# Patient Record
Sex: Male | Born: 1940 | Race: White | Hispanic: No | Marital: Married | Smoking: Current every day smoker
Health system: Southern US, Community
[De-identification: ages and names within clinical notes are randomized; demographics above are authoritative.]

## PROBLEM LIST (undated history)

## (undated) DIAGNOSIS — K52839 Microscopic colitis, unspecified: Secondary | ICD-10-CM

## (undated) DIAGNOSIS — M1990 Unspecified osteoarthritis, unspecified site: Secondary | ICD-10-CM

## (undated) DIAGNOSIS — K8020 Calculus of gallbladder without cholecystitis without obstruction: Secondary | ICD-10-CM

## (undated) DIAGNOSIS — K7460 Unspecified cirrhosis of liver: Secondary | ICD-10-CM

## (undated) DIAGNOSIS — K7581 Nonalcoholic steatohepatitis (NASH): Secondary | ICD-10-CM

## (undated) HISTORY — PX: OTHER SURGICAL HISTORY: SHX169

## (undated) HISTORY — DX: Nonalcoholic steatohepatitis (NASH): K75.81

## (undated) HISTORY — DX: Calculus of gallbladder without cholecystitis without obstruction: K80.20

## (undated) HISTORY — DX: Unspecified cirrhosis of liver: K74.60

## (undated) HISTORY — DX: Unspecified osteoarthritis, unspecified site: M19.90

## (undated) HISTORY — DX: Microscopic colitis, unspecified: K52.839

## (undated) HISTORY — PX: UPPER GI ENDOSCOPY: SHX6162

## (undated) HISTORY — PX: ANKLE SURGERY: SHX546

## (undated) HISTORY — PX: MULTIPLE TOOTH EXTRACTIONS: SHX2053

---

## 2015-11-25 ENCOUNTER — Encounter: Payer: Self-pay | Admitting: Family Medicine

## 2015-11-25 ENCOUNTER — Ambulatory Visit (INDEPENDENT_AMBULATORY_CARE_PROVIDER_SITE_OTHER): Payer: Medicare Other | Admitting: Family Medicine

## 2015-11-25 VITALS — BP 117/65 | HR 89 | Temp 98.1°F | Ht 68.0 in | Wt 228.4 lb

## 2015-11-25 DIAGNOSIS — K7581 Nonalcoholic steatohepatitis (NASH): Secondary | ICD-10-CM

## 2015-11-25 DIAGNOSIS — F17200 Nicotine dependence, unspecified, uncomplicated: Secondary | ICD-10-CM | POA: Insufficient documentation

## 2015-11-25 DIAGNOSIS — M170 Bilateral primary osteoarthritis of knee: Secondary | ICD-10-CM | POA: Insufficient documentation

## 2015-11-25 DIAGNOSIS — Z7689 Persons encountering health services in other specified circumstances: Secondary | ICD-10-CM | POA: Insufficient documentation

## 2015-11-25 DIAGNOSIS — I10 Essential (primary) hypertension: Secondary | ICD-10-CM | POA: Insufficient documentation

## 2015-11-25 DIAGNOSIS — K7460 Unspecified cirrhosis of liver: Secondary | ICD-10-CM | POA: Insufficient documentation

## 2015-11-25 DIAGNOSIS — Z131 Encounter for screening for diabetes mellitus: Secondary | ICD-10-CM

## 2015-11-25 DIAGNOSIS — Z23 Encounter for immunization: Secondary | ICD-10-CM | POA: Diagnosis not present

## 2015-11-25 DIAGNOSIS — Z9109 Other allergy status, other than to drugs and biological substances: Secondary | ICD-10-CM | POA: Diagnosis not present

## 2015-11-25 LAB — CBC
HEMATOCRIT: 40.5 % (ref 38.5–50.0)
Hemoglobin: 14 g/dL (ref 13.2–17.1)
MCH: 32.9 pg (ref 27.0–33.0)
MCHC: 34.6 g/dL (ref 32.0–36.0)
MCV: 95.1 fL (ref 80.0–100.0)
MPV: 9.9 fL (ref 7.5–12.5)
PLATELETS: 69 10*3/uL — AB (ref 140–400)
RBC: 4.26 MIL/uL (ref 4.20–5.80)
RDW: 14.3 % (ref 11.0–15.0)
WBC: 4.7 10*3/uL (ref 3.8–10.8)

## 2015-11-25 LAB — COMPLETE METABOLIC PANEL WITHOUT GFR
ALT: 28 U/L (ref 9–46)
AST: 41 U/L — ABNORMAL HIGH (ref 10–35)
Albumin: 3.3 g/dL — ABNORMAL LOW (ref 3.6–5.1)
Alkaline Phosphatase: 76 U/L (ref 40–115)
BUN: 12 mg/dL (ref 7–25)
CO2: 24 mmol/L (ref 20–31)
Calcium: 8.3 mg/dL — ABNORMAL LOW (ref 8.6–10.3)
Chloride: 108 mmol/L (ref 98–110)
Creat: 0.77 mg/dL (ref 0.70–1.18)
GFR, Est African American: >89 mL/min
GFR, Est Non African American: 88 mL/min
Glucose, Bld: 92 mg/dL (ref 65–99)
Potassium: 3.9 mmol/L (ref 3.5–5.3)
Sodium: 140 mmol/L (ref 135–146)
Total Bilirubin: 1.8 mg/dL — ABNORMAL HIGH (ref 0.2–1.2)
Total Protein: 6.6 g/dL (ref 6.1–8.1)

## 2015-11-25 LAB — LIPID PANEL
CHOL/HDL RATIO: 3.3 ratio (ref <5.0)
Cholesterol: 100 mg/dL (ref <200)
HDL: 30 mg/dL — AB (ref >40)
LDL CALC: 55 mg/dL (ref <100)
Triglycerides: 76 mg/dL (ref <150)
VLDL: 15 mg/dL (ref <30)

## 2015-11-25 LAB — POCT GLYCOSYLATED HEMOGLOBIN (HGB A1C): HEMOGLOBIN A1C: 5.2

## 2015-11-25 MED ORDER — FUROSEMIDE 40 MG PO TABS: 40.0000 mg | ORAL_TABLET | Freq: Every day | ORAL | 3 refills | Status: DC

## 2015-11-25 NOTE — Patient Instructions (Signed)
You were seen today in clinic to establish care.  You look well today.  I have checked some labs for you and I will contact you with these results.  I have also refilled your lasix 9m.    I would like to see you back in 3 months to discuss smoking cessation.   Very nice meeting you, Daniel Lambert. WRosalyn Gess [place] Resident PGY-1 11/25/2015 2:59 PM

## 2015-11-25 NOTE — Assessment & Plan Note (Signed)
Encounter to establish care New patient to the practice, who recently moved from [facility] with his wife.  Apparently up-to-date with her immunizations, health screenings. I will confirm this when I receive his medical record.  For now I will obtain some baseline labs, including CMET CBC, A1c and lipid panel.  - Follow-up CMET, CBC, A1c, lipid panel - f/u medical records - f/u 6 months for general follow up

## 2015-11-25 NOTE — Progress Notes (Signed)
    Subjective:  Daniel Lambert is a 75 y.o. male who presents to the [facility] today with a chief complaint of establish medical care  HPI:  Establish care: No complaints today, patient is here to establish care after recently moving from [facility].  Is reportedly up to date with all of his immunizations, and health screening.  Patient signed medical release forms.  I do not have any labs available to me.  NASH: Patient was diagnosed with NASH a "bunch of years ago".  Patient reports that he received abdominal ultrasounds twice yearly to "screen for cancer".  He was unclear of why.  Has never had a paracentesis.  Denies any fatigue, alcohol use or former IV drug use. Takes 60m lasix daily.   Tobacco use: Smokes half pack per day has been smoking this 60 years. Wife also smokes, and both of them are not ready to quit currently but are considering.  Patient denies any cough, chest pain, shortness of breath, LE swelling or history of VTE.    PMH: NASH, tobacco use,  Tobacco use: 1/2 PPD, 30 pack year history Medication: reviewed and updated ROS: see HPI   Objective:  Physical Exam: BP 117/65   Pulse 89   Temp 98.1 F (36.7 C) (Oral)   Ht 5' 8"  (1.727 m)   Wt 228 lb 6.4 oz (103.6 kg)   SpO2 97%   BMI 34.73 kg/m   Gen: 75yo M in NAD, resting comfortably CV: RRR with no murmurs appreciated Pulm: NWOB, CTAB with no crackles, wheezes, or rhonchi GI: Normal bowel sounds present. Soft, Nontender, Nondistended. MSK: no edema, cyanosis, or clubbing noted Skin: warm, dry Neuro: grossly normal, moves all extremities Psych: Normal affect and thought content  Results for orders placed or performed in visit on 11/25/15 (from the past 72 hour(s))  POCT glycosylated hemoglobin (Hb A1C)     Status: None   Collection Time: 11/25/15  3:06 PM  Result Value Ref Range   Hemoglobin A1C 5.2      Assessment/Plan:  Encounter to establish care Encounter to establish care New patient to the  practice, who recently moved from [facility].  Apparently up-to-date with her immunizations, health screenings. I will confirm this when I receive his medical record.  For now I will obtain some baseline labs, including CMET CBC, A1c and lipid panel.  - Follow-up CMET, CBC, A1c, lipid panel - f/u medical records - f/u 6 months for general follow up  Tobacco use disorder Tobacco use disorder Patient is a smoker for 60 years, currently smokes one half pack per day. Denies any cough, chest pain, shortness of breath, but does have some LLL crackles.  Spoke with patient and his wife about smoking cessation.  They are open to considering it in 6 months.  - Follow-up in 6 months, and consider referring patient to Dr. KGraylin Shiversmoking cessation clinic

## 2015-11-25 NOTE — Assessment & Plan Note (Signed)
Tobacco use disorder Patient is a smoker for 60 years, currently smokes one half pack per day. Denies any cough, chest pain, shortness of breath, but does have some LLL crackles.  Spoke with patient and his wife about smoking cessation.  They are open to considering it in 6 months.  - Follow-up in 6 months, and consider referring patient to Dr. Graylin Shiver smoking cessation clinic

## 2016-02-09 ENCOUNTER — Other Ambulatory Visit: Payer: Self-pay | Admitting: Family Medicine

## 2016-02-09 MED ORDER — FUROSEMIDE 40 MG PO TABS: 40.0000 mg | ORAL_TABLET | Freq: Every day | ORAL | 3 refills | Status: DC

## 2016-02-09 NOTE — Telephone Encounter (Signed)
Pt needs refills on Lasix and quaidine (don't know the correct spelling, it is not in pt's chart). Pt would like 3 refills and send Rx's to [facility]. ep

## 2016-03-07 ENCOUNTER — Other Ambulatory Visit: Payer: Self-pay | Admitting: Nurse Practitioner

## 2016-03-07 DIAGNOSIS — K7469 Other cirrhosis of liver: Secondary | ICD-10-CM

## 2016-03-07 DIAGNOSIS — K7581 Nonalcoholic steatohepatitis (NASH): Secondary | ICD-10-CM

## 2016-03-16 ENCOUNTER — Ambulatory Visit
Admission: RE | Admit: 2016-03-16 | Discharge: 2016-03-16 | Disposition: A | Discharge status: Home / Self Care | Payer: Medicare Other | Source: Ambulatory Visit | Attending: Nurse Practitioner | Admitting: Nurse Practitioner

## 2016-03-16 DIAGNOSIS — K7469 Other cirrhosis of liver: Secondary | ICD-10-CM

## 2016-03-16 DIAGNOSIS — K7581 Nonalcoholic steatohepatitis (NASH): Secondary | ICD-10-CM

## 2016-04-18 ENCOUNTER — Other Ambulatory Visit: Payer: Self-pay | Admitting: Family Medicine

## 2016-05-18 ENCOUNTER — Other Ambulatory Visit: Payer: Self-pay | Admitting: Family Medicine

## 2016-07-03 ENCOUNTER — Encounter: Payer: Self-pay | Admitting: Gastroenterology

## 2016-07-23 ENCOUNTER — Ambulatory Visit (INDEPENDENT_AMBULATORY_CARE_PROVIDER_SITE_OTHER): Payer: Medicare Other | Admitting: Family

## 2016-07-23 ENCOUNTER — Encounter: Payer: Self-pay | Admitting: Family

## 2016-07-23 VITALS — BP 130/70 | HR 62 | Temp 97.7°F | Resp 16 | Ht 68.0 in | Wt 224.0 lb

## 2016-07-23 DIAGNOSIS — K529 Noninfective gastroenteritis and colitis, unspecified: Secondary | ICD-10-CM | POA: Insufficient documentation

## 2016-07-23 DIAGNOSIS — Z7689 Persons encountering health services in other specified circumstances: Secondary | ICD-10-CM | POA: Diagnosis not present

## 2016-07-23 DIAGNOSIS — F17200 Nicotine dependence, unspecified, uncomplicated: Secondary | ICD-10-CM | POA: Diagnosis not present

## 2016-07-23 DIAGNOSIS — K7581 Nonalcoholic steatohepatitis (NASH): Secondary | ICD-10-CM

## 2016-07-23 DIAGNOSIS — K7460 Unspecified cirrhosis of liver: Secondary | ICD-10-CM | POA: Diagnosis not present

## 2016-07-23 MED ORDER — FUROSEMIDE 40 MG PO TABS: 40.0000 mg | ORAL_TABLET | Freq: Every day | ORAL | 0 refills | Status: DC

## 2016-07-23 MED ORDER — DIPHENOXYLATE-ATROPINE 2.5-0.025 MG PO TABS: 1.0000 | ORAL_TABLET | Freq: Two times a day (BID) | ORAL | 0 refills | Status: DC | PRN

## 2016-07-23 NOTE — Assessment & Plan Note (Signed)
Diagnosed with colitis and diarrhea symptoms controlled with Lomitl. Abdominal exam is benign with no symptoms of exacerbation. Has scheduled follow up with [facility]. Continue current dosage of Lotmil pending GI appointment.

## 2016-07-23 NOTE — Assessment & Plan Note (Signed)
Continues to smoke about 0.5 packs per day for about 60 years with current 30 pack year history. In the pre-contemplation stage of change with no intentions of quitting currently. Recommend tobacco cessation to reduce risk for cardiovascular, respiratory and malignant diseases in the future. Continue to monitor.

## 2016-07-23 NOTE — Patient Instructions (Signed)
Thank you for choosing [facility].  SUMMARY AND INSTRUCTIONS:  Continue to take your medications as prescribed.   Follow up with Dr. Ardis Hughs as scheduled.  Schedule for your physical/wellness at you convenience.   Medication:  Your prescription(s) have been submitted to your pharmacy or been printed and provided for you. Please take as directed and contact our office if you believe you are having problem(s) with the medication(s) or have any questions.  Labs:  Please stop by the lab on the lower level of the building for your blood work. Your results will be released to [place] (or called to you) after review, usually within 72 hours after test completion. If any changes need to be made, you will be notified at that same time.  1.) The lab is open from 7:30am to 5:30 pm Monday-Friday 2.) No appointment is necessary 3.) Fasting (if needed) is 6-8 hours after food and drink; black coffee and water are okay   Follow up:  If your symptoms worsen or fail to improve, please contact our office for further instruction, or in case of emergency go directly to the emergency room at the closest medical facility.

## 2016-07-23 NOTE — Progress Notes (Signed)
Subjective:    Patient ID: Daniel Lambert, male    DOB: [birth year], 76 y.o.   MRN: [id number]  Chief Complaint  Patient presents with  . Establish Care    colitis issues that he has an appointment on August 1st with GI    HPI:  Daniel Lambert is a 76 y.o. male who  has a past medical history of Arthritis; Chicken pox; Cirrhosis ([place]); Microscopic colitis; and Microscopic colitis. and presents today for an office visit to establish care.  1.) NASH - Currently maintained on furosemide for edema control. Reports taking the medication as prescribed and denies adverse side effects. No current right upper quadrant pain. Does have some mild/moderate lower extremity edema. Does not currently drink alcohol. No bleeding. Most recent imaging performed by   2.) Colitis - Previously diagnosed with colotis and maintained on Lomtil which helps control his symptoms. Reports taking the medicaiton as prescribed and denies adverse side effects. Endorses lower abdominal pain with no nausea, vomiting, constipation or blood in his stool. Eating and drinking okay. Frequency of bowel movements is 3-4 times per day.   Allergies  Allergen Reactions  . Penicillins Hives    itching      Outpatient Medications Prior to Visit  Medication Sig Dispense Refill  . furosemide (LASIX) 40 MG tablet TAKE 1 TABLET DAILY 30 tablet 3   No facility-administered medications prior to visit.      Past Medical History:  Diagnosis Date  . Arthritis   . Chicken pox   . Cirrhosis ([place])   . Microscopic colitis   . Microscopic colitis       Past Surgical History:  Procedure Laterality Date  . ANKLE SURGERY Right       Family History  Problem Relation Age of Onset  . Alzheimer's disease Mother   . Heart disease Father       Social History   Social History  . Marital status: Married    Spouse name: N/A  . Number of children: 3  . Years of education: 14   Occupational History  . Retired      Social History Main Topics  . Smoking status: Current Every Day Smoker    Packs/day: 0.50    Years: 60.00    Types: Cigarettes  . Smokeless tobacco: Never Used  . Alcohol use No  . Drug use: No  . Sexual activity: Not on file   Other Topics Concern  . Not on file   Social History Narrative   Fun/Hobby: Fishing         Review of Systems  Constitutional: Negative for chills and fever.  Respiratory: Negative for chest tightness and shortness of breath.   Cardiovascular: Negative for chest pain, palpitations and leg swelling.  Gastrointestinal: Positive for abdominal pain and diarrhea. Negative for blood in stool, constipation, rectal pain and vomiting.       Objective:    BP 130/70 (BP Location: Left Arm, Patient Position: Sitting, Cuff Size: Large)   Pulse 62   Temp 97.7 F (36.5 C) (Oral)   Resp 16   Ht 5' 8"  (1.727 m)   Wt 224 lb (101.6 kg)   SpO2 96%   BMI 34.06 kg/m  Nursing note and vital signs reviewed.  Physical Exam  Constitutional: He is oriented to person, place, and time. He appears well-developed and well-nourished. No distress.  Cardiovascular: Normal rate, regular rhythm, normal heart sounds and intact distal pulses.   Pulmonary/Chest: Effort normal and  breath sounds normal.  Abdominal: Normal appearance and bowel sounds are normal. He exhibits no mass. There is no hepatosplenomegaly. There is no tenderness. There is no rigidity, no guarding, no tenderness at McBurney's point and negative Murphy's sign.  Neurological: He is alert and oriented to person, place, and time.  Skin: Skin is warm and dry.  Psychiatric: He has a normal mood and affect. His behavior is normal. Judgment and thought content normal.        Assessment & Plan:   Problem List Items Addressed This Visit      Digestive   Liver cirrhosis secondary to NASH ([place]) - Primary    Appears stable and followed by local office of [facility] Specialist. No evidence of  exacerbation of symptoms or abdominal pain. Continue current dosage of furosemide. Consider addition of spiranolactone if edema continues. Liver panel per gastroenterology or liver specialist.       Colitis    Diagnosed with colitis and diarrhea symptoms controlled with Lomitl. Abdominal exam is benign with no symptoms of exacerbation. Has scheduled follow up with [facility]. Continue current dosage of Lotmil pending GI appointment.         Other   Encounter to establish care   Tobacco use disorder    Continues to smoke about 0.5 packs per day for about 60 years with current 30 pack year history. In the pre-contemplation stage of change with no intentions of quitting currently. Recommend tobacco cessation to reduce risk for cardiovascular, respiratory and malignant diseases in the future. Continue to monitor.           I have changed Mr. Saetern diphenoxylate-atropine and furosemide.   Meds ordered this encounter  Medications  . DISCONTD: diphenoxylate-atropine (LOMOTIL) 2.5-0.025 MG tablet    Sig: Take by mouth 2 (two) times daily.  . diphenoxylate-atropine (LOMOTIL) 2.5-0.025 MG tablet    Sig: Take 1 tablet by mouth 2 (two) times daily as needed for diarrhea or loose stools.    Dispense:  180 tablet    Refill:  0    Order Specific Question:   Supervising Provider    Answer:   Pricilla [place] A [[id number]]  . furosemide (LASIX) 40 MG tablet    Sig: Take 1 tablet (40 mg total) by mouth daily.    Dispense:  90 tablet    Refill:  0    Order Specific Question:   Supervising Provider    Answer:   Pricilla [place] A [[id number]]     Follow-up: Return in about 3 months (around 10/23/2016), or if symptoms worsen or fail to improve.  Mauricio Po, FNP

## 2016-07-23 NOTE — Assessment & Plan Note (Signed)
Appears stable and followed by local office of [facility] Specialist. No evidence of exacerbation of symptoms or abdominal pain. Continue current dosage of furosemide. Consider addition of spiranolactone if edema continues. Liver panel per gastroenterology or liver specialist.

## 2016-08-15 ENCOUNTER — Other Ambulatory Visit (INDEPENDENT_AMBULATORY_CARE_PROVIDER_SITE_OTHER): Payer: Medicare Other

## 2016-08-15 ENCOUNTER — Ambulatory Visit (INDEPENDENT_AMBULATORY_CARE_PROVIDER_SITE_OTHER): Payer: Medicare Other | Admitting: Gastroenterology

## 2016-08-15 ENCOUNTER — Encounter: Payer: Self-pay | Admitting: Gastroenterology

## 2016-08-15 VITALS — BP 118/64 | HR 84 | Ht 66.5 in | Wt 223.0 lb

## 2016-08-15 DIAGNOSIS — K7460 Unspecified cirrhosis of liver: Secondary | ICD-10-CM

## 2016-08-15 DIAGNOSIS — K52839 Microscopic colitis, unspecified: Secondary | ICD-10-CM

## 2016-08-15 LAB — COMPREHENSIVE METABOLIC PANEL
ALBUMIN: 3.3 g/dL — AB (ref 3.5–5.2)
ALK PHOS: 96 U/L (ref 39–117)
ALT: 24 U/L (ref 0–53)
AST: 37 U/L (ref 0–37)
BUN: 9 mg/dL (ref 6–23)
CALCIUM: 8.9 mg/dL (ref 8.4–10.5)
CHLORIDE: 105 meq/L (ref 96–112)
CO2: 28 mEq/L (ref 19–32)
CREATININE: 0.84 mg/dL (ref 0.40–1.50)
GFR: 94.22 mL/min (ref >60.00)
Glucose, Bld: 96 mg/dL (ref 70–99)
POTASSIUM: 3.5 meq/L (ref 3.5–5.1)
SODIUM: 138 meq/L (ref 135–145)
TOTAL PROTEIN: 7 g/dL (ref 6.0–8.3)
Total Bilirubin: 1.9 mg/dL — ABNORMAL HIGH (ref 0.2–1.2)

## 2016-08-15 LAB — CBC WITH DIFFERENTIAL/PLATELET
BASOS ABS: 0.1 10*3/uL (ref 0.0–0.1)
Basophils Relative: 1.2 % (ref 0.0–3.0)
EOS ABS: 0.3 10*3/uL (ref 0.0–0.7)
Eosinophils Relative: 5.3 % — ABNORMAL HIGH (ref 0.0–5.0)
HEMATOCRIT: 40.2 % (ref 39.0–52.0)
Hemoglobin: 13.6 g/dL (ref 13.0–17.0)
LYMPHS PCT: 17 % (ref 12.0–46.0)
Lymphs Abs: 1 10*3/uL (ref 0.7–4.0)
MCHC: 33.9 g/dL (ref 30.0–36.0)
MCV: 97.9 fl (ref 78.0–100.0)
Monocytes Absolute: 0.5 10*3/uL (ref 0.1–1.0)
Monocytes Relative: 7.7 % (ref 3.0–12.0)
Neutro Abs: 4 10*3/uL (ref 1.4–7.7)
Neutrophils Relative %: 68.8 % (ref 43.0–77.0)
Platelets: 85 10*3/uL — ABNORMAL LOW (ref 150.0–400.0)
RBC: 4.11 Mil/uL — ABNORMAL LOW (ref 4.22–5.81)
RDW: 15 % (ref 11.5–15.5)
WBC: 5.9 10*3/uL (ref 4.0–10.5)

## 2016-08-15 LAB — PROTIME-INR
INR: 1.3 ratio — AB (ref 0.8–1.0)
PROTHROMBIN TIME: 13.9 s — AB (ref 9.6–13.1)

## 2016-08-15 MED ORDER — DIPHENOXYLATE-ATROPINE 2.5-0.025 MG PO TABS: 1.0000 | ORAL_TABLET | Freq: Every day | ORAL | 4 refills | Status: DC | PRN

## 2016-08-15 NOTE — Progress Notes (Signed)
HPI: This is a very pleasant 76 year old man  who was referred to me by Dr. Florene Lambert for microscopic colitis, cirrhosis  Chief complaint is  He was told he had microscopic colitis in 2010.  He underwent colonoscopy for terrible diarrhea, watery.  He tells me he gets a colonoscopy about every 3 years.  HE doesn't recall ever having polyps.  He believes he's had two colonoscopy since the 2010 one.  Was losing weight.  He tried entocort at three pills per day and that did not help.  They considered perigoric or prednisone however he was not interested in those therapies. Eventually another opinion at [facility], tried lomotil which worked quite well.  Started with 4 lomotil per day but this was too much.  Eventually settled on lomotil one pill per day.  Was off of the med completely for 1-2 years but 3 months ago it restarted (terrible diarrhea).  Lomotil started again,   Imodium never worked.  No colon in the family.  He had been seen at [facility] liver clinic for many years for cirrhosis, fatty liver associated. He tells me he has no overt GI bleeding. He has intermittent issues with lower extremity swelling, currently only on Lasix. Does not seem to have any encephalopathy.  Old Data Reviewed:   He establish care at the [facility] liver clinic 6 months ago for [facility] cirrhosis. He would like me to take over his cirrhosis care and I'm happy to do that.      Review of systems: Pertinent positive and negative review of systems were noted in the above HPI section. All other review negative.   Past Medical History:  Diagnosis Date  . Arthritis   . Chicken pox   . Cirrhosis (Daniel Lambert)   . Gallstones   . Microscopic colitis   . NASH (nonalcoholic steatohepatitis)     Past Surgical History:  Procedure Laterality Date  . ANKLE SURGERY Right    x 2    Current Outpatient Prescriptions  Medication Sig Dispense Refill  . diphenoxylate-atropine (LOMOTIL) 2.5-0.025 MG  tablet Take 1 tablet by mouth 2 (two) times daily as needed for diarrhea or loose stools. 180 tablet 0  . furosemide (LASIX) 40 MG tablet Take 1 tablet (40 mg total) by mouth daily. 90 tablet 0   No current facility-administered medications for this visit.     Allergies as of 08/15/2016 - Review Complete 08/15/2016  Allergen Reaction Noted  . Entocort ec [budesonide] Itching and Rash 08/15/2016  . Penicillins Hives 07/23/2016    Family History  Problem Relation Age of Onset  . Alzheimer's disease Mother   . Rheum arthritis Mother   . Thyroid disease Mother   . Heart disease Father   . Thyroid disease Maternal Aunt   . Thyroid disease Maternal Aunt     Social History   Social History  . Marital status: Married    Spouse name: N/A  . Number of children: 3  . Years of education: 14   Occupational History  . Retired Chemical engineer    Social History Main Topics  . Smoking status: Current Every Day Smoker    Packs/day: 0.50    Years: 60.00    Types: Cigarettes  . Smokeless tobacco: Never Used  . Alcohol use No  . Drug use: No  . Sexual activity: Not on file   Other Topics Concern  . Not on file   Social History Narrative   Fun/Hobby: Insurance underwriter  Physical Exam: BP 118/64 (BP Location: Left Arm, Patient Position: Sitting, Cuff Size: Normal)   Pulse 84   Ht 5' 6.5" (1.689 m) Comment: height measured without shoes  Wt 223 lb (101.2 kg)   BMI 35.45 kg/m  Constitutional: generally well-appearing Psychiatric: alert and oriented x3 Eyes: extraocular movements intact Mouth: oral pharynx moist, no lesions Neck: supple no lymphadenopathy Cardiovascular: heart regular rate and rhythm Lungs: clear to auscultation bilaterally Abdomen: soft, nontender, nondistended, no obvious ascites, no peritoneal signs, normal bowel sounds Extremities: 1-2+ pitting edema bilaterally in his ankles Skin: no lesions on visible extremities   Assessment and plan: 76 y.o. male  with  microscopic colitis, Daniel Lambert associated cirrhosis  I'm happy to take over care of both of these issues. His microscopic colitis seems to be under very good control currently on Lomotil one pill once daily. He should continue this indefinitely. I will refill that prescription whenever he needs. We'll get records from his previous gastroenterologist including colonoscopy reports, pathology reports. He also has cirrhosis, [place] associated. Several months ago he establish care and [facility] he would like to switch his care here so he only has to see one doctor. I'm happy to do that for him. He does have 1-2+ pitting edema in his ankles right now. He is on Lasix monotherapy at 60 mg per day currently. I'm going to get a set of blood work including CBC, complete about profile, coags, alpha-fetoprotein. I will likely start him on Aldactone at 1 or 200 mg daily pending review of his electrolytes and kidney function. He needs hepatoma screening also with an ultrasound and we'll order that for him today. He'll return to see me in 2 months and sooner if needed.    Please see the "Patient Instructions" section for addition details about the plan.   Daniel Loffler, MD [facility] 08/15/2016, 10:09 AM  Cc: Daniel Levels, MD

## 2016-08-15 NOTE — Patient Instructions (Addendum)
We will get records sent from your previous gastroenterologist for review.  This will include any endoscopic (colonoscopy or upper endoscopy) procedures and any associated pathology reports.   Will refill lomotil at one pill once daily when you need them.  You will have labs checked today in the basement lab.  Please head down after you check out with the front desk  (cbc, cmet, inr, AFP).  You will be set up for an ultrasound for cirrhosis, hepatoma screening. Will probably start you on new diuretic (aldactone) pending the blood test results. (see below)  It is important that you have a relatively low salt diet.  High salt diet can cause fluid to accumulate in your legs, abdomen and even around your lungs. You should try to avoid NSAID type over the counter pain medicines as best as possible. Tylenol is safe to take for 'routine' aches and pains, but never take more than 1/2 the dose suggested on the package instructions (never more than 2 grams per day). Avoid alcohol.  Please return to see Dr. Ardis Hughs in 2 months.  You have been scheduled for an abdominal ultrasound at [facility] (1st floor of hospital) on 08-17-16 at 9:30am. Please arrive 15 minutes prior to your appointment for registration. Make certain not to have anything to eat or drink 6 hours prior to your appointment. Should you need to reschedule your appointment, please contact radiology at [phone]. This test typically takes about 30 minutes to perform.  We have sent the following medications to your pharmacy for you to pick up at your convenience: Lomotil  Normal BMI (Body Mass Index- based on height and weight) is between 23 and 30. Your BMI today is Body mass index is 35.45 kg/m. Marland Kitchen Please consider follow up  regarding your BMI with your Primary Care Provider.

## 2016-08-16 LAB — AFP TUMOR MARKER: AFP TUMOR MARKER: 3.4 ng/mL (ref <6.1)

## 2016-08-17 ENCOUNTER — Ambulatory Visit (HOSPITAL_COMMUNITY)
Admission: RE | Admit: 2016-08-17 | Discharge: 2016-08-17 | Disposition: A | Discharge status: Home / Self Care | Payer: Medicare Other | Source: Ambulatory Visit | Attending: Gastroenterology | Admitting: Gastroenterology

## 2016-08-17 DIAGNOSIS — K8020 Calculus of gallbladder without cholecystitis without obstruction: Secondary | ICD-10-CM | POA: Insufficient documentation

## 2016-08-17 DIAGNOSIS — K838 Other specified diseases of biliary tract: Secondary | ICD-10-CM | POA: Diagnosis not present

## 2016-08-17 DIAGNOSIS — K7460 Unspecified cirrhosis of liver: Secondary | ICD-10-CM | POA: Diagnosis present

## 2016-08-17 DIAGNOSIS — R161 Splenomegaly, not elsewhere classified: Secondary | ICD-10-CM | POA: Insufficient documentation

## 2016-08-21 ENCOUNTER — Other Ambulatory Visit: Payer: Self-pay

## 2016-08-21 DIAGNOSIS — K7460 Unspecified cirrhosis of liver: Secondary | ICD-10-CM

## 2016-08-21 DIAGNOSIS — K831 Obstruction of bile duct: Secondary | ICD-10-CM

## 2016-08-21 MED ORDER — FUROSEMIDE 40 MG PO TABS: 80.0000 mg | ORAL_TABLET | Freq: Every day | ORAL | 0 refills | Status: DC

## 2016-08-21 MED ORDER — SPIRONOLACTONE 100 MG PO TABS
200.0000 mg | ORAL_TABLET | Freq: Every day | ORAL | 11 refills | Status: DC
Start: 2016-08-21 — End: 2016-08-25

## 2016-08-25 ENCOUNTER — Telehealth: Payer: Self-pay | Admitting: Internal Medicine

## 2016-08-25 ENCOUNTER — Other Ambulatory Visit: Payer: Self-pay | Admitting: Internal Medicine

## 2016-08-25 MED ORDER — SPIRONOLACTONE 100 MG PO TABS: 200.0000 mg | ORAL_TABLET | Freq: Every day | ORAL | 11 refills | Status: DC

## 2016-08-25 NOTE — Telephone Encounter (Signed)
Needs spironolactone rx sent to [facility] I did this

## 2016-08-27 ENCOUNTER — Ambulatory Visit (HOSPITAL_COMMUNITY)
Admission: RE | Admit: 2016-08-27 | Discharge: 2016-08-27 | Disposition: A | Discharge status: Home / Self Care | Payer: Medicare Other | Source: Ambulatory Visit | Attending: Gastroenterology | Admitting: Gastroenterology

## 2016-08-27 ENCOUNTER — Other Ambulatory Visit (INDEPENDENT_AMBULATORY_CARE_PROVIDER_SITE_OTHER): Payer: Medicare Other

## 2016-08-27 DIAGNOSIS — K7460 Unspecified cirrhosis of liver: Secondary | ICD-10-CM

## 2016-08-27 DIAGNOSIS — R161 Splenomegaly, not elsewhere classified: Secondary | ICD-10-CM | POA: Insufficient documentation

## 2016-08-27 DIAGNOSIS — N281 Cyst of kidney, acquired: Secondary | ICD-10-CM | POA: Insufficient documentation

## 2016-08-27 DIAGNOSIS — R188 Other ascites: Secondary | ICD-10-CM | POA: Insufficient documentation

## 2016-08-27 DIAGNOSIS — K831 Obstruction of bile duct: Secondary | ICD-10-CM

## 2016-08-27 DIAGNOSIS — K8020 Calculus of gallbladder without cholecystitis without obstruction: Secondary | ICD-10-CM | POA: Diagnosis not present

## 2016-08-27 LAB — BASIC METABOLIC PANEL
BUN: 10 mg/dL (ref 6–23)
CO2: 27 mEq/L (ref 19–32)
Calcium: 8.9 mg/dL (ref 8.4–10.5)
Chloride: 106 mEq/L (ref 96–112)
Creatinine, Ser: 0.73 mg/dL (ref 0.40–1.50)
GFR: 110.78 mL/min (ref >60.00)
Glucose, Bld: 110 mg/dL — ABNORMAL HIGH (ref 70–99)
POTASSIUM: 3.5 meq/L (ref 3.5–5.1)
SODIUM: 138 meq/L (ref 135–145)

## 2016-08-27 MED ORDER — GADOBENATE DIMEGLUMINE 529 MG/ML IV SOLN
20.0000 mL | Freq: Once | INTRAVENOUS | Status: AC | PRN
  Administered 2016-08-27: 20 mL via INTRAVENOUS

## 2016-09-03 ENCOUNTER — Other Ambulatory Visit: Payer: Self-pay

## 2016-09-03 DIAGNOSIS — K7460 Unspecified cirrhosis of liver: Secondary | ICD-10-CM

## 2016-09-03 DIAGNOSIS — K51919 Ulcerative colitis, unspecified with unspecified complications: Secondary | ICD-10-CM

## 2016-09-04 ENCOUNTER — Telehealth: Payer: Self-pay

## 2016-09-04 NOTE — Telephone Encounter (Signed)
Pt has been advised to have repeat labs at his soonest convenience.

## 2016-09-04 NOTE — Telephone Encounter (Signed)
-----   Message from Jeoffrey Massed, RN sent at 08/21/2016 10:32 AM EDT ----- Pt needs labs in 2 weeks see 08/21/16 note

## 2016-09-05 ENCOUNTER — Other Ambulatory Visit (INDEPENDENT_AMBULATORY_CARE_PROVIDER_SITE_OTHER): Payer: Medicare Other

## 2016-09-05 DIAGNOSIS — K7460 Unspecified cirrhosis of liver: Secondary | ICD-10-CM | POA: Diagnosis not present

## 2016-09-05 DIAGNOSIS — K51919 Ulcerative colitis, unspecified with unspecified complications: Secondary | ICD-10-CM

## 2016-09-05 LAB — BASIC METABOLIC PANEL
BUN: 18 mg/dL (ref 6–23)
CALCIUM: 10.6 mg/dL — AB (ref 8.4–10.5)
CO2: 30 meq/L (ref 19–32)
CREATININE: 1.13 mg/dL (ref 0.40–1.50)
Chloride: 99 mEq/L (ref 96–112)
GFR: 66.9 mL/min (ref >60.00)
GLUCOSE: 135 mg/dL — AB (ref 70–99)
Potassium: 4.1 mEq/L (ref 3.5–5.1)
SODIUM: 137 meq/L (ref 135–145)

## 2016-09-11 ENCOUNTER — Other Ambulatory Visit: Payer: Self-pay

## 2016-09-11 ENCOUNTER — Telehealth: Payer: Self-pay

## 2016-09-11 DIAGNOSIS — K7469 Other cirrhosis of liver: Secondary | ICD-10-CM

## 2016-09-11 NOTE — Telephone Encounter (Signed)
Ms. Soberano notified. She will make certain the patient comes in for labs. Tomorrow is the plan.

## 2016-09-11 NOTE — Telephone Encounter (Signed)
-----   Message from Milus Banister, MD sent at 09/11/2016  8:00 AM EDT -----   Please call the patient.  The labs all look ok.  No changed to diuretics. Has appt in October and I'd like another BMEt 1-2 days prior to that appt. thanks

## 2016-09-11 NOTE — Telephone Encounter (Signed)
Spoke with both patient and his spouse. Pleased with the lab results and agrees to the plan of care. Reports new onset of dark urine and generalized fatigue. Spells of decreased mental acuity though Mrs. Herst says this has only happened a couple of times. Denies noticing any yellowing of eyes or skin. No nausea. No itching.

## 2016-09-11 NOTE — Telephone Encounter (Signed)
Ok, thanks  Lets get some labs on him (ammonia and lfts)  Thanks  dj

## 2016-09-12 ENCOUNTER — Observation Stay (HOSPITAL_COMMUNITY): Payer: Medicare Other

## 2016-09-12 ENCOUNTER — Emergency Department (HOSPITAL_COMMUNITY): Payer: Medicare Other

## 2016-09-12 ENCOUNTER — Encounter (HOSPITAL_COMMUNITY): Payer: Self-pay | Admitting: Physician Assistant

## 2016-09-12 ENCOUNTER — Observation Stay (HOSPITAL_COMMUNITY)
Admission: EM | Admit: 2016-09-12 | Discharge: 2016-09-13 | Disposition: A | Discharge status: Home / Self Care | Payer: Medicare Other | Attending: Internal Medicine | Admitting: Internal Medicine

## 2016-09-12 DIAGNOSIS — N183 Chronic kidney disease, stage 3 (moderate): Secondary | ICD-10-CM | POA: Diagnosis not present

## 2016-09-12 DIAGNOSIS — D696 Thrombocytopenia, unspecified: Secondary | ICD-10-CM | POA: Diagnosis not present

## 2016-09-12 DIAGNOSIS — Z880 Allergy status to penicillin: Secondary | ICD-10-CM | POA: Insufficient documentation

## 2016-09-12 DIAGNOSIS — G9341 Metabolic encephalopathy: Secondary | ICD-10-CM | POA: Diagnosis not present

## 2016-09-12 DIAGNOSIS — R9082 White matter disease, unspecified: Secondary | ICD-10-CM | POA: Insufficient documentation

## 2016-09-12 DIAGNOSIS — I130 Hypertensive heart and chronic kidney disease with heart failure and stage 1 through stage 4 chronic kidney disease, or unspecified chronic kidney disease: Secondary | ICD-10-CM | POA: Insufficient documentation

## 2016-09-12 DIAGNOSIS — I6782 Cerebral ischemia: Secondary | ICD-10-CM | POA: Diagnosis not present

## 2016-09-12 DIAGNOSIS — R4182 Altered mental status, unspecified: Secondary | ICD-10-CM

## 2016-09-12 DIAGNOSIS — K7581 Nonalcoholic steatohepatitis (NASH): Secondary | ICD-10-CM | POA: Diagnosis not present

## 2016-09-12 DIAGNOSIS — F17210 Nicotine dependence, cigarettes, uncomplicated: Secondary | ICD-10-CM | POA: Insufficient documentation

## 2016-09-12 DIAGNOSIS — R410 Disorientation, unspecified: Secondary | ICD-10-CM

## 2016-09-12 DIAGNOSIS — Z9109 Other allergy status, other than to drugs and biological substances: Secondary | ICD-10-CM | POA: Diagnosis present

## 2016-09-12 DIAGNOSIS — R161 Splenomegaly, not elsewhere classified: Secondary | ICD-10-CM | POA: Diagnosis not present

## 2016-09-12 DIAGNOSIS — Z888 Allergy status to other drugs, medicaments and biological substances status: Secondary | ICD-10-CM | POA: Diagnosis not present

## 2016-09-12 DIAGNOSIS — K7469 Other cirrhosis of liver: Secondary | ICD-10-CM | POA: Insufficient documentation

## 2016-09-12 DIAGNOSIS — Z8673 Personal history of transient ischemic attack (TIA), and cerebral infarction without residual deficits: Secondary | ICD-10-CM | POA: Insufficient documentation

## 2016-09-12 DIAGNOSIS — I10 Essential (primary) hypertension: Secondary | ICD-10-CM | POA: Diagnosis present

## 2016-09-12 DIAGNOSIS — Z79899 Other long term (current) drug therapy: Secondary | ICD-10-CM | POA: Insufficient documentation

## 2016-09-12 DIAGNOSIS — N179 Acute kidney failure, unspecified: Secondary | ICD-10-CM | POA: Diagnosis not present

## 2016-09-12 DIAGNOSIS — K8020 Calculus of gallbladder without cholecystitis without obstruction: Secondary | ICD-10-CM | POA: Diagnosis not present

## 2016-09-12 DIAGNOSIS — Z8719 Personal history of other diseases of the digestive system: Secondary | ICD-10-CM | POA: Diagnosis not present

## 2016-09-12 DIAGNOSIS — E860 Dehydration: Secondary | ICD-10-CM | POA: Diagnosis not present

## 2016-09-12 DIAGNOSIS — K7460 Unspecified cirrhosis of liver: Secondary | ICD-10-CM | POA: Diagnosis not present

## 2016-09-12 LAB — URINALYSIS, ROUTINE W REFLEX MICROSCOPIC
BACTERIA UA: NONE SEEN
Bilirubin Urine: NEGATIVE
Glucose, UA: NEGATIVE mg/dL
Ketones, ur: NEGATIVE mg/dL
Leukocytes, UA: NEGATIVE
Nitrite: NEGATIVE
PH: 6 (ref 5.0–8.0)
Protein, ur: NEGATIVE mg/dL
SPECIFIC GRAVITY, URINE: 1.011 (ref 1.005–1.030)

## 2016-09-12 LAB — COMPREHENSIVE METABOLIC PANEL
ALBUMIN: 3.5 g/dL (ref 3.5–5.0)
ALK PHOS: 93 U/L (ref 38–126)
ALT: 33 U/L (ref 17–63)
ANION GAP: 7 (ref 5–15)
AST: 45 U/L — ABNORMAL HIGH (ref 15–41)
BUN: 24 mg/dL — ABNORMAL HIGH (ref 6–20)
CALCIUM: 9.7 mg/dL (ref 8.9–10.3)
CHLORIDE: 104 mmol/L (ref 101–111)
CO2: 23 mmol/L (ref 22–32)
Creatinine, Ser: 1.46 mg/dL — ABNORMAL HIGH (ref 0.61–1.24)
GFR calc Af Amer: 52 mL/min — ABNORMAL LOW (ref >60)
GFR calc non Af Amer: 45 mL/min — ABNORMAL LOW (ref >60)
GLUCOSE: 116 mg/dL — AB (ref 65–99)
POTASSIUM: 4.5 mmol/L (ref 3.5–5.1)
SODIUM: 134 mmol/L — AB (ref 135–145)
Total Bilirubin: 3.3 mg/dL — ABNORMAL HIGH (ref 0.3–1.2)
Total Protein: 7.9 g/dL (ref 6.5–8.1)

## 2016-09-12 LAB — I-STAT CHEM 8, ED
BUN: 27 mg/dL — ABNORMAL HIGH (ref 6–20)
CALCIUM ION: 1.17 mmol/L (ref 1.15–1.40)
Chloride: 103 mmol/L (ref 101–111)
Creatinine, Ser: 1.3 mg/dL — ABNORMAL HIGH (ref 0.61–1.24)
GLUCOSE: 114 mg/dL — AB (ref 65–99)
HCT: 43 % (ref 39.0–52.0)
HEMOGLOBIN: 14.6 g/dL (ref 13.0–17.0)
Potassium: 4.5 mmol/L (ref 3.5–5.1)
SODIUM: 137 mmol/L (ref 135–145)
TCO2: 22 mmol/L (ref 22–32)

## 2016-09-12 LAB — DIFFERENTIAL
BASOS PCT: 1 %
Basophils Absolute: 0 10*3/uL (ref 0.0–0.1)
EOS PCT: 5 %
Eosinophils Absolute: 0.3 10*3/uL (ref 0.0–0.7)
LYMPHS PCT: 15 %
Lymphs Abs: 1 10*3/uL (ref 0.7–4.0)
Monocytes Absolute: 0.8 10*3/uL (ref 0.1–1.0)
Monocytes Relative: 12 %
NEUTROS PCT: 68 %
Neutro Abs: 4.5 10*3/uL (ref 1.7–7.7)

## 2016-09-12 LAB — CBC
HCT: 41.6 % (ref 39.0–52.0)
Hemoglobin: 14.6 g/dL (ref 13.0–17.0)
MCH: 33 pg (ref 26.0–34.0)
MCHC: 35.1 g/dL (ref 30.0–36.0)
MCV: 94.1 fL (ref 78.0–100.0)
PLATELETS: 82 10*3/uL — AB (ref 150–400)
RBC: 4.42 MIL/uL (ref 4.22–5.81)
RDW: 14.4 % (ref 11.5–15.5)
WBC: 6.6 10*3/uL (ref 4.0–10.5)

## 2016-09-12 LAB — RAPID URINE DRUG SCREEN, HOSP PERFORMED
AMPHETAMINES: NOT DETECTED
BENZODIAZEPINES: NOT DETECTED
Barbiturates: NOT DETECTED
COCAINE: NOT DETECTED
OPIATES: NOT DETECTED
TETRAHYDROCANNABINOL: NOT DETECTED

## 2016-09-12 LAB — HEPATIC FUNCTION PANEL
ALBUMIN: 3.7 g/dL (ref 3.5–5.0)
ALK PHOS: 82 U/L (ref 38–126)
ALT: 31 U/L (ref 17–63)
AST: 43 U/L — ABNORMAL HIGH (ref 15–41)
BILIRUBIN DIRECT: 0.7 mg/dL — AB (ref 0.1–0.5)
BILIRUBIN INDIRECT: 2.9 mg/dL — AB (ref 0.3–0.9)
BILIRUBIN TOTAL: 3.6 mg/dL — AB (ref 0.3–1.2)
Total Protein: 8 g/dL (ref 6.5–8.1)

## 2016-09-12 LAB — TSH: TSH: 0.819 u[IU]/mL (ref 0.350–4.500)

## 2016-09-12 LAB — AMMONIA: AMMONIA: 52 umol/L — AB (ref 9–35)

## 2016-09-12 LAB — I-STAT TROPONIN, ED: Troponin i, poc: 0.02 ng/mL (ref 0.00–0.08)

## 2016-09-12 LAB — PROTIME-INR
INR: 1.26
PROTHROMBIN TIME: 15.7 s — AB (ref 11.4–15.2)

## 2016-09-12 LAB — APTT: aPTT: 32 seconds (ref 24–36)

## 2016-09-12 LAB — ETHANOL

## 2016-09-12 MED ORDER — LACTULOSE 10 GM/15ML PO SOLN
30.0000 g | Freq: Once | ORAL | Status: AC
  Administered 2016-09-13: 30 g via ORAL
  Filled 2016-09-12: qty 45

## 2016-09-12 MED ORDER — SPIRONOLACTONE 100 MG PO TABS
100.0000 mg | ORAL_TABLET | Freq: Every day | ORAL | Status: DC
  Administered 2016-09-13: 100 mg via ORAL
  Filled 2016-09-12: qty 4
  Filled 2016-09-12: qty 1

## 2016-09-12 MED ORDER — IOPAMIDOL (ISOVUE-300) INJECTION 61%
INTRAVENOUS | Status: AC
  Filled 2016-09-12: qty 100

## 2016-09-12 MED ORDER — SODIUM CHLORIDE 0.9 % IV BOLUS (SEPSIS)
500.0000 mL | Freq: Once | INTRAVENOUS | Status: AC
  Administered 2016-09-13: 500 mL via INTRAVENOUS

## 2016-09-12 MED ORDER — ONDANSETRON HCL 4 MG/2ML IJ SOLN: 4.0000 mg | Freq: Four times a day (QID) | INTRAMUSCULAR | Status: DC | PRN

## 2016-09-12 MED ORDER — DIPHENOXYLATE-ATROPINE 2.5-0.025 MG PO TABS: 1.0000 | ORAL_TABLET | Freq: Every day | ORAL | Status: DC | PRN

## 2016-09-12 MED ORDER — ONDANSETRON HCL 4 MG PO TABS: 4.0000 mg | ORAL_TABLET | Freq: Four times a day (QID) | ORAL | Status: DC | PRN

## 2016-09-12 MED ORDER — FUROSEMIDE 40 MG PO TABS
40.0000 mg | ORAL_TABLET | Freq: Every day | ORAL | Status: DC
  Administered 2016-09-13: 40 mg via ORAL
  Filled 2016-09-12: qty 1

## 2016-09-12 MED ORDER — HYDRALAZINE HCL 20 MG/ML IJ SOLN: 5.0000 mg | Freq: Three times a day (TID) | INTRAMUSCULAR | Status: DC | PRN

## 2016-09-12 MED ORDER — ACETAMINOPHEN 325 MG PO TABS
650.0000 mg | ORAL_TABLET | Freq: Four times a day (QID) | ORAL | Status: DC | PRN
  Administered 2016-09-12: 650 mg via ORAL

## 2016-09-12 MED ORDER — ACETAMINOPHEN 650 MG RE SUPP: 650.0000 mg | Freq: Four times a day (QID) | RECTAL | Status: DC | PRN

## 2016-09-12 MED ORDER — SODIUM CHLORIDE 0.9 % IV SOLN
INTRAVENOUS | Status: DC
  Administered 2016-09-12 – 2016-09-13 (×2): via INTRAVENOUS

## 2016-09-12 NOTE — H&P (Signed)
History and Physical    MONTERIUS ROLF OIT:[id number] DOB: Apr 04, 1940 DOA: 09/12/2016   PCP: [facility], FNP   Patient coming from:  Home    Chief Complaint: Confusion   HPI: Daniel Lambert is a 76 y.o. 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 History is obtained from wife, the patient continues to be confused during the examination. She reports that recently, he had an increase of his Lasix and was started on spironolactone which is"coincidental" with this acute state. Of note, the patient has been followed by [facility] and had an MRI which was of concern for a liver neoplastic process. He denies any alcohol use. No apparent slurred speech, or unilateral weakness. No apparent recent infections. No pain. For wife report, no dysuria, or gross hematuria, but he is urine has been darker. Wife denies any light stools in the patient. No apparent chest pain or shortness of breath. He has significant itching. No seizures. Not putting headaches or vision changes. No recent hospitalizations or surgeries.   ED Course:  BP 124/70   Pulse 86   Temp 98.7 F (37.1 C)   Resp 16   SpO2 99%   PT 15.7 platelets 82,000 sodium 134 glucose 116 creatinine 1.46   AST 45  bilirubin 3.3 GFR 45 Ammonia is pending Troponin 0.02 UDS negative  alcohol negative  SR normal QTC CT head, MRI brain negative CXR neg   Review of Systems:  As per HPI otherwise all other systems reviewed and are negative  Past Medical History:  Diagnosis Date  . Arthritis   . Chicken pox   . Cirrhosis ([place])   . Gallstones   . Microscopic colitis   . NASH (nonalcoholic steatohepatitis)     Past Surgical History:  Procedure Laterality Date  . ANKLE SURGERY Right    x 2    Social History Social History   Social History  .  Marital status: Married    Spouse name: N/A  . Number of children: 3  . Years of education: 14   Occupational History  . Retired Chemical engineer    Social History Main Topics  . Smoking status: Current Every Day Smoker    Packs/day: 0.50    Years: 60.00    Types: Cigarettes  . Smokeless tobacco: Never Used  . Alcohol use No  . Drug use: No  . Sexual activity: Not on file   Other Topics Concern  . Not on file   Social History Narrative   Fun/Hobby: Fishing        Allergies  Allergen Reactions  . Entocort Ec [Budesonide] Itching and Rash  . Penicillins Hives    Itching Has patient had a PCN reaction causing immediate rash, facial/tongue/throat swelling, SOB or lightheadedness with hypotension: Yes Has patient had a PCN reaction causing severe rash involving mucus membranes or skin necrosis: No Has patient had a PCN reaction that required hospitalization: No Has patient had a PCN reaction occurring within the last 10 years: No If all of the above answers are "NO", then may proceed with Cephalosporin use.     Family History  Problem Relation Age of Onset  . Alzheimer's disease Mother   . Rheum arthritis Mother   . Thyroid disease Mother   . Heart disease Father   . Thyroid disease Maternal  Aunt   . Thyroid disease Maternal Aunt       Prior to Admission medications   Medication Sig Start Date End Date Taking? Authorizing Provider  diphenoxylate-atropine (LOMOTIL) 2.5-0.025 MG tablet Take 1 tablet by mouth daily as needed for diarrhea or loose stools. 08/15/16  Yes Milus Banister, MD  furosemide (LASIX) 40 MG tablet Take 2 tablets (80 mg total) by mouth daily. 08/21/16  Yes Milus Banister, MD  spironolactone (ALDACTONE) 100 MG tablet Take 2 tablets (200 mg total) by mouth daily. 08/25/16 09/24/16 Yes Gatha Mayer, MD    Physical Exam:  Vitals:   09/12/16 1122 09/12/16 1200 09/12/16 1427 09/12/16 1429  BP: 120/65 121/77 124/70   Pulse: 81 82 86   Resp: 16  18 16    Temp: 98.9 F (37.2 C)   98.7 F (37.1 C)  TempSrc: Oral     SpO2: 96% 98% 99%    Constitutional: NAD, calm, awake but appears confused  Eyes: PERRL, lids and conjunctivae normal ENMT: Mucous membranes are moist, without exudate or lesions  Neck: normal, supple, no masses, no thyromegaly Respiratory: clear to auscultation bilaterally, no wheezing, no crackles. Normal respiratory effort  Cardiovascular: Regular rate and rhythm, no murmurs, rubs or gallops. Trace lower  extremity edema. 2+ pedal pulses. No carotid bruits.  Abdomen: Soft, non tender, No masses palpable. Bowel sounds positive.  Musculoskeletal: no clubbing / cyanosis. Moves all extremities Skin: no jaundice, No lesions.  Neurologic: Sensation intact  Strength appears to be equal in all extremities, bur he is slow to follow commands  Psychiatric:   oriented to self and place, not alert to date.     Labs on Admission: I have personally reviewed following labs and imaging studies  CBC:  Recent Labs Lab 09/12/16 1159 09/12/16 1212  WBC 6.6  --   NEUTROABS 4.5  --   HGB 14.6 14.6  HCT 41.6 43.0  MCV 94.1  --   PLT 82*  --     Basic Metabolic Panel:  Recent Labs Lab 09/12/16 1159 09/12/16 1212  NA 134* 137  K 4.5 4.5  CL 104 103  CO2 23  --   GLUCOSE 116* 114*  BUN 24* 27*  CREATININE 1.46* 1.30*  CALCIUM 9.7  --     GFR: CrCl cannot be calculated (Unknown ideal weight.).  Liver Function Tests:  Recent Labs Lab 09/12/16 1159  AST 45*  ALT 33  ALKPHOS 93  BILITOT 3.3*  PROT 7.9  ALBUMIN 3.5   No results for input(s): LIPASE, AMYLASE in the last 168 hours. No results for input(s): AMMONIA in the last 168 hours.  Coagulation Profile:  Recent Labs Lab 09/12/16 1159  INR 1.26    Cardiac Enzymes: No results for input(s): CKTOTAL, CKMB, CKMBINDEX, TROPONINI in the last 168 hours.  BNP (last 3 results) No results for input(s): PROBNP in the last 8760 hours.  HbA1C: No  results for input(s): HGBA1C in the last 72 hours.  CBG: No results for input(s): GLUCAP in the last 168 hours.  Lipid Profile: No results for input(s): CHOL, HDL, LDLCALC, TRIG, CHOLHDL, LDLDIRECT in the last 72 hours.  Thyroid Function Tests: No results for input(s): TSH, T4TOTAL, FREET4, T3FREE, THYROIDAB in the last 72 hours.  Anemia Panel: No results for input(s): VITAMINB12, FOLATE, FERRITIN, TIBC, IRON, RETICCTPCT in the last 72 hours.  Urine analysis:    Component Value Date/Time   COLORURINE YELLOW 09/12/2016 [place] 09/12/2016 1214  LABSPEC 1.011 09/12/2016 1214   PHURINE 6.0 09/12/2016 1214   GLUCOSEU NEGATIVE 09/12/2016 1214   HGBUR SMALL (A) 09/12/2016 1214   BILIRUBINUR NEGATIVE 09/12/2016 1214   KETONESUR NEGATIVE 09/12/2016 1214   PROTEINUR NEGATIVE 09/12/2016 1214   NITRITE NEGATIVE 09/12/2016 1214   LEUKOCYTESUR NEGATIVE 09/12/2016 1214    Sepsis Labs: @LABRCNTIP (procalcitonin:4,lacticidven:4) )No results found for this or any previous visit (from the past 240 hour(s)).   Radiological Exams on Admission: Dg Chest 2 View  Result Date: 09/12/2016 CLINICAL DATA:  Acute mental status changes and history of CHF. EXAM: CHEST  2 VIEW COMPARISON:  None. FINDINGS: The heart size is at the upper limits of normal. The aorta is mildly ectatic. Lungs show pulmonary venous hypertensive changes without evidence of overt edema. There is no evidence of airspace consolidation, pneumothorax, nodule or pleural fluid. The bony thorax is unremarkable. IMPRESSION: Evidence of pulmonary venous hypertension without overt pulmonary edema. Electronically Signed   By: Aletta Edouard M.D.   On: 09/12/2016 13:13   Ct Head Wo Contrast  Result Date: 09/12/2016 CLINICAL DATA:  Altered mental status EXAM: CT HEAD WITHOUT CONTRAST TECHNIQUE: Contiguous axial images were obtained from the base of the skull through the vertex without intravenous contrast. COMPARISON:  None.  FINDINGS: Brain: No evidence of acute infarction, hemorrhage, hydrocephalus, extra-axial collection or mass lesion/mass effect. Moderate chronic small vessel ischemia in the cerebral white matter. Remote lacunar infarct in the left thalamus. Vascular: Atherosclerotic calcification.  No hyperdense vessel. Skull: No acute or aggressive finding. Sinuses/Orbits: No acute finding.  Bilateral cataract resection. IMPRESSION: 1. No acute finding. 2. Moderate chronic microvascular ischemia. Electronically Signed   By: Monte Fantasia M.D.   On: 09/12/2016 11:41   Mr Brain Wo Contrast (neuro Protocol)  Result Date: 09/12/2016 CLINICAL DATA:  Altered level of consciousness today EXAM: MRI HEAD WITHOUT CONTRAST TECHNIQUE: Multiplanar, multiecho pulse sequences of the brain and surrounding structures were obtained without intravenous contrast. COMPARISON:  Head CT from earlier today FINDINGS: Brain: No acute infarction, hemorrhage, hydrocephalus, extra-axial collection or mass lesion. Moderate patchy T2 and FLAIR hyperintensity in the cerebral white matter, nonspecific but usually from chronic small vessel ischemia, especially given patient's history of hypertension and smoking. There has been a remote lacunar infarct in the central left thalamus. Generalized cerebral volume loss, age congruent. Vascular: The left vertebral artery flow void is not seen at the level of the C1 ring, possibly artifactual on this first imaging slice as there is good intradural flow void. Skull and upper cervical spine: Negative for marrow lesion. Sinuses/Orbits: No acute finding.  Bilateral cataract resection. IMPRESSION: 1. No acute finding. 2. Moderate cerebral white matter disease, likely chronic small vessel ischemia. Electronically Signed   By: Monte Fantasia M.D.   On: 09/12/2016 14:04    EKG: Independently reviewed.  Assessment/Plan Active Problems:   Confusion   Environmental allergies   Liver cirrhosis secondary to NASH [facility])    Essential hypertension    Acute encephalopathy in a patient with a history of NASH. Per chart, patient had MRI concerning for neoplastic process versus hepatic fibrosis, being followed as OP . Etiology unclear, liver disease versus meds induced versus dehydration   Not hypoxic. T max CT head, MRI brain negative AST 45 bilirubin 3.3 , Na 134 Ammonia is pending CXR neg ETOH and UDS neg   Admit to tele obs  Vitamin B-12   Await ammonia level TSH  follow up with GI for liver MRI and probable biopsy as  OP  Hepatitis panel HIV Repeat CMET  IVF     Acute Chronic kidney disease stage 3   baseline creatinine , BL 0.8, currently 1.4  Lab Results  Component Value Date   CREATININE 1.30 (H) 09/12/2016   CREATININE 1.46 (H) 09/12/2016   CREATININE 1.13 09/05/2016   IVF Reduce  diuretics Repeat CMET in am   Thrombocytopenia in the setting of liver disease, currently at 82, at baseline No transfusion is indicated at this time Monitor counts closely   Hypertension BP 118/76   Pulse 78   Controlled Continue home anti-hypertensive medications at half dose for now per wife's request  Add Hydralazine Q6 hours as needed for BP 160/90    DVT prophylaxis: SCD  Code Status:   Full   Family Communication:  Discussed with patient Disposition Plan: Expect patient to be discharged to home after condition improves Consults called:    None  Admission status:Tele  Obs     Linnie Mcglocklin E, PA-C [facility]   09/12/2016, 3:03 PM

## 2016-09-12 NOTE — ED Notes (Signed)
Admitting at bedside 

## 2016-09-12 NOTE — ED Triage Notes (Signed)
To ED via [facility] for eval of being aloc this am. Per EMS pt working in yard all day yesterday. Had recent increase in Lasix dose with decreased swelling of BLE. Concerned for dehydration. Pt able to answer all oriention questions appropriately. Denies pain

## 2016-09-12 NOTE — ED Notes (Signed)
Pt transported to US

## 2016-09-12 NOTE — ED Notes (Signed)
Pt back from MRI 

## 2016-09-12 NOTE — ED Provider Notes (Signed)
[place] DEPT Provider Note   CSN: [id number] Arrival date & time: 09/12/16  1101     History   Chief Complaint Chief Complaint  Patient presents with  . Altered Mental Status    HPI Daniel Lambert is a 76 y.o. male.  Patient brought in by [facility]. Patient with altered mental status this morning. Last seen completely normal last evening around 11:00. Patient had a recent increase in his Lasix and has recently been started on spironolactone. Patient has a history of nonalcoholic cirrhosis for many years. Recently undergoing workup for concern for liver neoplastic process. Had MRI recently.Followed by [facility] gastroenterology Dr. Owens Loffler. Patient does not drink alcohol. Patient's wife got up prior to him was up for several hours before he got up. She heard him get up but did not interact with him. She heard him fumbling around the bathroom went to check on him and she noticed that he appeared very confused.Very slow to respond. No obvious slurred speech but speech was very slow. No obvious focal deficits.Nothing like this is ever happened before.      Past Medical History:  Diagnosis Date  . Arthritis   . Chicken pox   . Cirrhosis ([place])   . Gallstones   . Microscopic colitis   . NASH (nonalcoholic steatohepatitis)     Patient Active Problem List   Diagnosis Date Noted  . Colitis 07/23/2016  . Environmental allergies 11/25/2015  . Osteoarthritis of knees, bilateral 11/25/2015  . Liver cirrhosis secondary to NASH ([place]) 11/25/2015  . Essential hypertension 11/25/2015  . Encounter to establish care 11/25/2015  . Tobacco use disorder 11/25/2015    Past Surgical History:  Procedure Laterality Date  . ANKLE SURGERY Right    x 2       Home Medications    Prior to Admission medications   Medication Sig Start Date End Date Taking? Authorizing Provider  diphenoxylate-atropine (LOMOTIL) 2.5-0.025 MG tablet Take 1 tablet by mouth daily as needed for  diarrhea or loose stools. 08/15/16  Yes Milus Banister, MD  furosemide (LASIX) 40 MG tablet Take 2 tablets (80 mg total) by mouth daily. 08/21/16  Yes Milus Banister, MD  spironolactone (ALDACTONE) 100 MG tablet Take 2 tablets (200 mg total) by mouth daily. 08/25/16 09/24/16 Yes Gatha Mayer, MD    Family History Family History  Problem Relation Age of Onset  . Alzheimer's disease Mother   . Rheum arthritis Mother   . Thyroid disease Mother   . Heart disease Father   . Thyroid disease Maternal Aunt   . Thyroid disease Maternal Aunt     Social History Social History  Substance Use Topics  . Smoking status: Current Every Day Smoker    Packs/day: 0.50    Years: 60.00    Types: Cigarettes  . Smokeless tobacco: Never Used  . Alcohol use No     Allergies   Entocort ec [budesonide] and Penicillins   Review of Systems Review of Systems  Unable to perform ROS: Mental status change     Physical Exam Updated Vital Signs BP 121/77   Pulse 82   Temp 98.9 F (37.2 C) (Oral)   Resp 18   SpO2 98%   Physical Exam  Constitutional: He appears well-developed and well-nourished. No distress.  HENT:  Head: Normocephalic and atraumatic.  Mouth/Throat: Oropharynx is clear and moist.  Eyes: Pupils are equal, round, and reactive to light. EOM are normal.  Neck: Neck supple.  Pulmonary/Chest:  Effort normal and breath sounds normal.  Abdominal: Soft. Bowel sounds are normal. He exhibits no distension. There is no tenderness.  Musculoskeletal: Normal range of motion. He exhibits edema.  Neurological: He is alert. No cranial nerve deficit or sensory deficit. He exhibits normal muscle tone. Coordination normal.  Awake very slow to follow commands very slow to answer questions has a dazed appearance.  Skin: Skin is warm.  Nursing note and vitals reviewed.    ED Treatments / Results  Labs (all labs ordered are listed, but only abnormal results are displayed) Labs Reviewed    PROTIME-INR - Abnormal; Notable for the following:       Result Value   Prothrombin Time 15.7 (*)    All other components within normal limits  CBC - Abnormal; Notable for the following:    Platelets 82 (*)    All other components within normal limits  COMPREHENSIVE METABOLIC PANEL - Abnormal; Notable for the following:    Sodium 134 (*)    Glucose, Bld 116 (*)    BUN 24 (*)    Creatinine, Ser 1.46 (*)    AST 45 (*)    Total Bilirubin 3.3 (*)    GFR calc non Af Amer 45 (*)    GFR calc Af Amer 52 (*)    All other components within normal limits  URINALYSIS, ROUTINE W REFLEX MICROSCOPIC - Abnormal; Notable for the following:    Hgb urine dipstick SMALL (*)    Squamous Epithelial / LPF 0-5 (*)    All other components within normal limits  I-STAT CHEM 8, ED - Abnormal; Notable for the following:    BUN 27 (*)    Creatinine, Ser 1.30 (*)    Glucose, Bld 114 (*)    All other components within normal limits  ETHANOL  APTT  DIFFERENTIAL  RAPID URINE DRUG SCREEN, HOSP PERFORMED  AMMONIA  I-STAT TROPONIN, ED    EKG  EKG Interpretation  Date/Time:  Wednesday September 12 2016 11:18:46 EDT Ventricular Rate:  82 PR Interval:    QRS Duration: 78 QT Interval:  378 QTC Calculation: 442 R Axis:   17 Text Interpretation:  Sinus rhythm Atrial premature complex Probable anteroseptal infarct, old Borderline T abnormalities, inferior leads No previous ECGs available Confirmed by Fredia Sorrow [phone]) on 09/12/2016 11:27:08 AM       Radiology Dg Chest 2 View  Result Date: 09/12/2016 CLINICAL DATA:  Acute mental status changes and history of CHF. EXAM: CHEST  2 VIEW COMPARISON:  None. FINDINGS: The heart size is at the upper limits of normal. The aorta is mildly ectatic. Lungs show pulmonary venous hypertensive changes without evidence of overt edema. There is no evidence of airspace consolidation, pneumothorax, nodule or pleural fluid. The bony thorax is unremarkable. IMPRESSION:  Evidence of pulmonary venous hypertension without overt pulmonary edema. Electronically Signed   By: Aletta Edouard M.D.   On: 09/12/2016 13:13   Ct Head Wo Contrast  Result Date: 09/12/2016 CLINICAL DATA:  Altered mental status EXAM: CT HEAD WITHOUT CONTRAST TECHNIQUE: Contiguous axial images were obtained from the base of the skull through the vertex without intravenous contrast. COMPARISON:  None. FINDINGS: Brain: No evidence of acute infarction, hemorrhage, hydrocephalus, extra-axial collection or mass lesion/mass effect. Moderate chronic small vessel ischemia in the cerebral white matter. Remote lacunar infarct in the left thalamus. Vascular: Atherosclerotic calcification.  No hyperdense vessel. Skull: No acute or aggressive finding. Sinuses/Orbits: No acute finding.  Bilateral cataract resection. IMPRESSION: 1. No acute  finding. 2. Moderate chronic microvascular ischemia. Electronically Signed   By: Monte Fantasia M.D.   On: 09/12/2016 11:41   Mr Brain Wo Contrast (neuro Protocol)  Result Date: 09/12/2016 CLINICAL DATA:  Altered level of consciousness today EXAM: MRI HEAD WITHOUT CONTRAST TECHNIQUE: Multiplanar, multiecho pulse sequences of the brain and surrounding structures were obtained without intravenous contrast. COMPARISON:  Head CT from earlier today FINDINGS: Brain: No acute infarction, hemorrhage, hydrocephalus, extra-axial collection or mass lesion. Moderate patchy T2 and FLAIR hyperintensity in the cerebral white matter, nonspecific but usually from chronic small vessel ischemia, especially given patient's history of hypertension and smoking. There has been a remote lacunar infarct in the central left thalamus. Generalized cerebral volume loss, age congruent. Vascular: The left vertebral artery flow void is not seen at the level of the C1 ring, possibly artifactual on this first imaging slice as there is good intradural flow void. Skull and upper cervical spine: Negative for marrow  lesion. Sinuses/Orbits: No acute finding.  Bilateral cataract resection. IMPRESSION: 1. No acute finding. 2. Moderate cerebral white matter disease, likely chronic small vessel ischemia. Electronically Signed   By: Monte Fantasia M.D.   On: 09/12/2016 14:04    Procedures Procedures (including critical care time)  Medications Ordered in ED Medications - No data to display   Initial Impression / Assessment and Plan / ED Course  I have reviewed the triage vital signs and the nursing notes.  Pertinent labs & imaging results that were available during my care of the patient were reviewed by me and considered in my medical decision making (see chart for details).    The patient initially treated as a possible CVA. Not within the TPA windows a code stroke was not called. However head CT and MRI brain without any significant findings. Other concern was perhaps electrolyte abnormalities due to the increase in his Lasix. But there is no significant changes there. Only change in labs is really an increase in her baseline bilirubin of 1.8 up to 3.3.   Extensive workup for altered mental status that occurred overnight. Last seen normal 11:00 last night, without any significant findings other than an increase in his total bili to 3.3. Patient has a history of unspecified cirrhosis felt to be nonalcoholic related. Recently had MRI of his abdomen to evaluate this further. He is followed by Owens Loffler from [facility] gastroenterology.  Ammonia level is pending. His abdomen here is flat and soft and not particular tender. Patient is awake and will answer questions but is slow to respond. Wife says he is not his normal self. Urine drug screen also negative. Only new medication is spirinolactone. And there was an increase inhis Lasix recently. But no significant electrolyte abnormalities.  Final Clinical Impressions(s) / ED Diagnoses   Final diagnoses:  Altered mental status, unspecified altered mental  status type  Cirrhosis of liver without ascites, unspecified hepatic cirrhosis type [facility])    New Prescriptions New Prescriptions   No medications on file     Fredia Sorrow, MD 09/12/16 1515

## 2016-09-12 NOTE — ED Notes (Signed)
No LSN reported by EMS

## 2016-09-12 NOTE — ED Notes (Signed)
Transported to CT on monitor

## 2016-09-13 DIAGNOSIS — K7581 Nonalcoholic steatohepatitis (NASH): Secondary | ICD-10-CM | POA: Diagnosis not present

## 2016-09-13 DIAGNOSIS — R4182 Altered mental status, unspecified: Secondary | ICD-10-CM | POA: Diagnosis not present

## 2016-09-13 DIAGNOSIS — K7460 Unspecified cirrhosis of liver: Secondary | ICD-10-CM | POA: Diagnosis not present

## 2016-09-13 DIAGNOSIS — I10 Essential (primary) hypertension: Secondary | ICD-10-CM | POA: Diagnosis not present

## 2016-09-13 DIAGNOSIS — R410 Disorientation, unspecified: Secondary | ICD-10-CM | POA: Diagnosis not present

## 2016-09-13 LAB — COMPREHENSIVE METABOLIC PANEL
ALT: 27 U/L (ref 17–63)
AST: 40 U/L (ref 15–41)
Albumin: 3.2 g/dL — ABNORMAL LOW (ref 3.5–5.0)
Alkaline Phosphatase: 67 U/L (ref 38–126)
Anion gap: 7 (ref 5–15)
BUN: 25 mg/dL — AB (ref 6–20)
CHLORIDE: 110 mmol/L (ref 101–111)
CO2: 22 mmol/L (ref 22–32)
CREATININE: 1.17 mg/dL (ref 0.61–1.24)
Calcium: 9.4 mg/dL (ref 8.9–10.3)
GFR calc Af Amer: >60 mL/min (ref >60)
GFR, EST NON AFRICAN AMERICAN: 59 mL/min — AB (ref >60)
GLUCOSE: 96 mg/dL (ref 65–99)
Potassium: 4.6 mmol/L (ref 3.5–5.1)
SODIUM: 139 mmol/L (ref 135–145)
Total Bilirubin: 3.2 mg/dL — ABNORMAL HIGH (ref 0.3–1.2)
Total Protein: 7.2 g/dL (ref 6.5–8.1)

## 2016-09-13 LAB — CBC
HCT: 39 % (ref 39.0–52.0)
Hemoglobin: 13.2 g/dL (ref 13.0–17.0)
MCH: 32.4 pg (ref 26.0–34.0)
MCHC: 33.8 g/dL (ref 30.0–36.0)
MCV: 95.8 fL (ref 78.0–100.0)
PLATELETS: 80 10*3/uL — AB (ref 150–400)
RBC: 4.07 MIL/uL — ABNORMAL LOW (ref 4.22–5.81)
RDW: 14.6 % (ref 11.5–15.5)
WBC: 5.6 10*3/uL (ref 4.0–10.5)

## 2016-09-13 LAB — AMMONIA: Ammonia: 68 umol/L — ABNORMAL HIGH (ref 9–35)

## 2016-09-13 MED ORDER — FUROSEMIDE 40 MG PO TABS: 40.0000 mg | ORAL_TABLET | Freq: Every day | ORAL | 0 refills | Status: DC

## 2016-09-13 MED ORDER — SPIRONOLACTONE 100 MG PO TABS: 100.0000 mg | ORAL_TABLET | Freq: Every day | ORAL | 3 refills | Status: DC

## 2016-09-13 NOTE — Discharge Summary (Signed)
Physician Discharge Summary   Patient ID: Daniel Lambert MRN: [id number] DOB/AGE: 77-20-1942 77 y.o.  Admit date: 09/12/2016 Discharge date: 09/13/2016  Primary Care Physician:  Golden Circle, FNP  Discharge Diagnoses:       Dehydration  Acute kidney injury  Acute Metabolic encephalopathy . Environmental allergies . Liver cirrhosis secondary to NASH ([place]) . Essential hypertension   Consults: none   Recommendations for Outpatient Follow-up:  1. Please repeat CBC/CMET, ammonia at next visit   DIET:heart healthy diet    Allergies:   Allergies  Allergen Reactions  . Entocort Ec [Budesonide] Itching and Rash  . Penicillins Hives    Itching Has patient had a PCN reaction causing immediate rash, facial/tongue/throat swelling, SOB or lightheadedness with hypotension: Yes Has patient had a PCN reaction causing severe rash involving mucus membranes or skin necrosis: No Has patient had a PCN reaction that required hospitalization: No Has patient had a PCN reaction occurring within the last 10 years: No If all of the above answers are "NO", then may proceed with Cephalosporin use.      DISCHARGE MEDICATIONS: Current Discharge Medication List    CONTINUE these medications which have CHANGED   Details  furosemide (LASIX) 40 MG tablet Take 1 tablet (40 mg total) by mouth daily. Qty: 90 tablet, Refills: 0    spironolactone (ALDACTONE) 100 MG tablet Take 1 tablet (100 mg total) by mouth daily. Qty: 30 tablet, Refills: 3      CONTINUE these medications which have NOT CHANGED   Details  diphenoxylate-atropine (LOMOTIL) 2.5-0.025 MG tablet Take 1 tablet by mouth daily as needed for diarrhea or loose stools. Qty: 30 tablet, Refills: 4         Brief H and P: For complete details please refer to admission H and P, but in brief Daniel Lambert is a 76 y.o. 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 History is obtained from wife, the patient continues to be confused during the examination. She reports that recently, he had an increase of his Lasix and was started on spironolactone which is "coincidental" with this acute state. Of note, the patient has been followed by G.I., and had an MRI which was of concern for a liver neoplastic process. He denies any alcohol use. No apparent slurred speech, or unilateral weakness. No apparent recent infections. No pain. For wife report, no dysuria, or gross hematuria, but he is urine has been darker. Wife denies any light stools in the patient. No apparent chest pain or shortness of breath. He has significant itching. No seizures. Not putting headaches or vision changes. No recent hospitalizations or surgeries  Hospital Course:   Acute metabolic encephalopathy with underlying history of NASH, cirrhosis likely due to dehydration and acute kidney injury - Currently alert and oriented 3, likely due to acute kidney injury, dehydration. CT head, MRI brain was negative for acute intracranial pathology - Chest x-ray negative for any pneumonia - ammonia level was elevated at 52 and patient received 1 dose of lactulose - Discussed about lactulose with the patient and wife at the bedside. Patient has history of microscopic colitis and has been taking Lomotil due to diarrhea. - currently alert and oriented and hold off on lactulose at home, given he already has chronic diarrhea. - recommended follow up with his gastroenterologist in 1  week   Dehydration, acute kidney injury on chronic kidney disease stage III - improved, patient presented with creatinine of 1.3, improved to 1.1 at the time of discharge. - Recently increased Lasix to 80 mg daily and spironolactone to 200 mg daily and patient had been working out in hot weather for 4-5 hours yesterday per  wife. - Lasix was decreased to 40 mg daily, Aldactone 100 mg daily, patient was provided IV fluid hydration. - creatinine improved to 1.1 - follow CMET   Thrombocytopenia - in the setting of liver disease currently at baseline, no transfusion is indicated  Hypertension - Currently BP soft, changed Lasix to 40 mg daily, Aldactone100 mg daily  Day of Discharge BP 114/76 (BP Location: Right Arm)   Pulse 87   Temp 98.7 F (37.1 C) (Oral)   Resp 16   Ht 5' 7"  (1.702 m)   Wt 92.6 kg (204 lb 1.6 oz)   SpO2 97%   BMI 31.97 kg/m   Physical Exam: General: Alert and awake oriented x3 not in any acute distress. HEENT: anicteric sclera, pupils reactive to light and accommodation CVS: S1-S2 clear no murmur rubs or gallops Chest: clear to auscultation bilaterally, no wheezing rales or rhonchi Abdomen: soft nontender, nondistended, normal bowel sounds Extremities: no cyanosis, clubbing or edema noted bilaterally Neuro: Cranial nerves II-XII intact, no focal neurological deficits   The results of significant diagnostics from this hospitalization (including imaging, microbiology, ancillary and laboratory) are listed below for reference.    LAB RESULTS: Basic Metabolic Panel:  Recent Labs Lab 09/12/16 1159 09/12/16 1212 09/13/16 0509  NA 134* 137 139  K 4.5 4.5 4.6  CL 104 103 110  CO2 23  --  22  GLUCOSE 116* 114* 96  BUN 24* 27* 25*  CREATININE 1.46* 1.30* 1.17  CALCIUM 9.7  --  9.4   Liver Function Tests:  Recent Labs Lab 09/12/16 1818 09/13/16 0509  AST 43* 40  ALT 31 27  ALKPHOS 82 67  BILITOT 3.6* 3.2*  PROT 8.0 7.2  ALBUMIN 3.7 3.2*   No results for input(s): LIPASE, AMYLASE in the last 168 hours.  Recent Labs Lab 09/12/16 1435 09/13/16 0509  AMMONIA 52* 68*   CBC:  Recent Labs Lab 09/12/16 1159 09/12/16 1212 09/13/16 0509  WBC 6.6  --  5.6  NEUTROABS 4.5  --   --   HGB 14.6 14.6 13.2  HCT 41.6 43.0 39.0  MCV 94.1  --  95.8  PLT 82*  --  80*    Cardiac Enzymes: No results for input(s): CKTOTAL, CKMB, CKMBINDEX, TROPONINI in the last 168 hours. BNP: Invalid input(s): POCBNP CBG: No results for input(s): GLUCAP in the last 168 hours.  Significant Diagnostic Studies:  Dg Chest 2 View  Result Date: 09/12/2016 CLINICAL DATA:  Acute mental status changes and history of CHF. EXAM: CHEST  2 VIEW COMPARISON:  None. FINDINGS: The heart size is at the upper limits of normal. The aorta is mildly ectatic. Lungs show pulmonary venous hypertensive changes without evidence of overt edema. There is no evidence of airspace consolidation, pneumothorax, nodule or pleural fluid. The bony thorax is unremarkable. IMPRESSION: Evidence of pulmonary venous hypertension without overt pulmonary edema. Electronically Signed   By: Aletta Edouard M.D.   On: 09/12/2016 13:13   Ct Head Wo Contrast  Result Date: 09/12/2016 CLINICAL DATA:  Altered mental status EXAM: CT HEAD WITHOUT CONTRAST TECHNIQUE: Contiguous axial images were obtained from the base of the skull through the  vertex without intravenous contrast. COMPARISON:  None. FINDINGS: Brain: No evidence of acute infarction, hemorrhage, hydrocephalus, extra-axial collection or mass lesion/mass effect. Moderate chronic small vessel ischemia in the cerebral white matter. Remote lacunar infarct in the left thalamus. Vascular: Atherosclerotic calcification.  No hyperdense vessel. Skull: No acute or aggressive finding. Sinuses/Orbits: No acute finding.  Bilateral cataract resection. IMPRESSION: 1. No acute finding. 2. Moderate chronic microvascular ischemia. Electronically Signed   By: Monte Fantasia M.D.   On: 09/12/2016 11:41   Mr Brain Wo Contrast (neuro Protocol)  Result Date: 09/12/2016 CLINICAL DATA:  Altered level of consciousness today EXAM: MRI HEAD WITHOUT CONTRAST TECHNIQUE: Multiplanar, multiecho pulse sequences of the brain and surrounding structures were obtained without intravenous contrast.  COMPARISON:  Head CT from earlier today FINDINGS: Brain: No acute infarction, hemorrhage, hydrocephalus, extra-axial collection or mass lesion. Moderate patchy T2 and FLAIR hyperintensity in the cerebral white matter, nonspecific but usually from chronic small vessel ischemia, especially given patient's history of hypertension and smoking. There has been a remote lacunar infarct in the central left thalamus. Generalized cerebral volume loss, age congruent. Vascular: The left vertebral artery flow void is not seen at the level of the C1 ring, possibly artifactual on this first imaging slice as there is good intradural flow void. Skull and upper cervical spine: Negative for marrow lesion. Sinuses/Orbits: No acute finding.  Bilateral cataract resection. IMPRESSION: 1. No acute finding. 2. Moderate cerebral white matter disease, likely chronic small vessel ischemia. Electronically Signed   By: Monte Fantasia M.D.   On: 09/12/2016 14:04   US Abdomen Complete  Result Date: 09/12/2016 CLINICAL DATA:  Nonalcoholic steatohepatitis. Cirrhosis. Mental status change. EXAM: ABDOMEN ULTRASOUND COMPLETE COMPARISON:  MRI 08/27/2016 FINDINGS: Gallbladder: Echogenic stones in the gallbladder. Largest stone measures 2.4 cm. No gallbladder wall thickening. No sonographic Murphy sign. Common bile duct: Diameter: 1 cm. Minimally changed from the previous MR. Liver: Liver is diffusely nodular and mildly heterogeneous. Findings are compatible with cirrhosis. No focal liver lesion. Portal vein is patent on color Doppler imaging with normal direction of blood flow towards the liver. IVC: No abnormality visualized. Pancreas: Visualized portion unremarkable. Spleen: Spleen is enlarged measuring 16.8 x 16.3 x 9.8 cm. Calculated volume is 1408 mL. Trace perisplenic ascites. Right Kidney: Length: 12.1 cm. Echogenicity within normal limits. No mass or hydronephrosis visualized. Left Kidney: Length: 13.6 cm. Normal echogenicity. No  hydronephrosis. Anechoic cyst in the left kidney mid pole region measures up to 3.6 cm. Abdominal aorta: No aneurysm visualized. Other findings: Trace fluid around the spleen and liver. IMPRESSION: Cirrhosis and splenomegaly. Trace ascites in the upper abdomen. No acute findings compared to recent MR. Cholelithiasis. Electronically Signed   By: Markus Daft M.D.   On: 09/12/2016 17:28    2D ECHO:   Disposition and Follow-up:    DISPOSITION:  Home    DISCHARGE FOLLOW-UP Follow-up Information    Milus Banister, MD. Schedule an appointment as soon as possible for a visit in 10 day(s).   Specialty:  Gastroenterology Why:  for hospital follow-up Contact information: [address] [place] [address] [phone]        Golden Circle, FNP. Schedule an appointment as soon as possible for a visit in 2 week(s).   Specialty:  Family Medicine Contact information: [place] [address] [phone]            Time spent on Discharge: 29 mins   Signed:   Estill Cotta M.D. [facility] 09/13/2016, 10:43 AM Pager: [phone]

## 2016-09-13 NOTE — Progress Notes (Signed)
Patient discharge teaching given, including activity, diet, follow-up appoints, and medications. Patient verbalized understanding of all discharge instructions. IV access was d/c'd. Vitals are stable. Skin is intact except as charted in most recent assessments. Pt to be escorted out by NT, to be driven home by family. 

## 2016-09-14 ENCOUNTER — Telehealth: Payer: Self-pay | Admitting: *Deleted

## 2016-09-14 NOTE — Telephone Encounter (Signed)
Transition Care Management Follow-up Telephone Call   Date discharged? 09/13/16   How have you been since you were released from the hospital? Spoke w/pt wife she states pt doing ok   Do you understand why you were in the hospital? YES   Do you understand the discharge instructions? YES   Where were you discharged to? Home   Items Reviewed:  Medications reviewed: YES  Allergies reviewed: YES  Dietary changes reviewed: YES, heart healthy  Referrals reviewed: No referral needed   Functional Questionnaire:   Activities of Daily Living (ADLs):   He states he are independent in the following: ambulation, bathing and hygiene, feeding, continence, grooming, toileting and dressing States he doesn't require assistance   Any transportation issues/concerns?: NO   Any patient concerns? YES   Confirmed importance and date/time of follow-up visits scheduled YES, 09/20/16  Provider Appointment booked with Terri Piedra  Confirmed with patient if condition begins to worsen call PCP or go to the ER.  Patient was given the office number and encouraged to call back with question or concerns.  : YES

## 2016-09-19 ENCOUNTER — Encounter: Payer: Self-pay | Admitting: Nurse Practitioner

## 2016-09-19 ENCOUNTER — Ambulatory Visit (INDEPENDENT_AMBULATORY_CARE_PROVIDER_SITE_OTHER): Payer: Medicare Other | Admitting: Nurse Practitioner

## 2016-09-19 VITALS — BP 110/60 | HR 80 | Ht 66.5 in | Wt 208.2 lb

## 2016-09-19 DIAGNOSIS — K7460 Unspecified cirrhosis of liver: Secondary | ICD-10-CM

## 2016-09-19 DIAGNOSIS — K7290 Hepatic failure, unspecified without coma: Secondary | ICD-10-CM

## 2016-09-19 DIAGNOSIS — K7682 Hepatic encephalopathy: Secondary | ICD-10-CM

## 2016-09-19 NOTE — Patient Instructions (Addendum)
If you are age 77 or older, your body mass index should be between 23-30. Your Body mass index is 33.11 kg/m. If this is out of the aforementioned range listed, please consider follow up with your Primary Care Provider.  If you are age 26 or younger, your body mass index should be between 19-25. Your Body mass index is 33.11 kg/m. If this is out of the aformentioned range listed, please consider follow up with your Primary Care Provider.   Continue same diuretics.   Follow up with Dr. Ardis Hughs on 11/23/16 at 9:45 a.m.  Thank you for choosing me and [facility].   Tye Savoy, NP

## 2016-09-19 NOTE — Progress Notes (Signed)
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 Ardis Hughs. He was previously cared for in [place], [place]. At his 08/15/16 visit here he had some peripheral edema. His renal function was okay so we increased lasix from 60 mg daily to 80 mg daily. Additionally Aldactone 200 mg daily was added. AFP was normal. Ultrasound negative for focal liver lesions but + splenomegaly, + cholelithiasis and dilated proximal CBD at 10-12 mm. MRCP done and CBD normal size. BMET done 8/13, renal function normal on new diuretic dose.  On 09/11/16 patient was admitted with altered mental status and AKI. Wife describes orientation to person but not time or anything else. Since discharge he has been back on 40 mg of lasix daily and aldactone 100 mg daily. On day of discharge Cr was 1.17, down from 1.46. No significant edema. He feels okay.   Patient is here with wife. She is a little upset about the recent diuretic changes, felt they were too quick or too much. Patient is diligent with low sodium diet. He hasn't had any further confusion. Lactulose wasn't started in the hospital as patient takes Lomotil for chronic diarrhea / hx of microscopic colitis. His BMs are normal on daily imodium.   Past Medical History:  Diagnosis Date  . Arthritis   . Chicken pox   . Cirrhosis ([place])   . Gallstones   . Microscopic colitis   . NASH (nonalcoholic steatohepatitis)     Patient's surgical history, family medical history, social history, medications and allergies were all reviewed in Epic    Physical Exam: BP 110/60 (BP Location: Left Arm, Patient Position: Sitting, Cuff Size: Normal)   Pulse 80   Ht 5' 6.5" (1.689 m)   Wt 208 lb 4 oz (94.5 kg)   BMI 33.11 kg/m   GENERAL: well developed white male in NAD PSYCH: :Pleasant, cooperative, normal affect EENT:  conjunctiva pink, mucous membranes moist, neck supple without masses CARDIAC:  RRR, no murmur heard, 1+ BLE  edema PULM: Normal respiratory effort, lungs CTA bilaterally, no wheezing ABDOMEN:  soft, nontender, nondistended, no obvious masses, no hepatomegaly,  normal bowel sounds SKIN:  turgor, no lesions seen Musculoskeletal:  Normal muscle tone, normal strength NEURO: Alert and oriented x 3, no focal neurologic deficits. No asterixis  Previous endoscopies:   EGD Sept 2016 for varices screening:  Dr. Nadara Eaton in [place], [place]. Findings: small esophageal varices and portal hypertensive gastropathy.Gastric biopsies c/w chronic gastritis with intestinal metaplasia, no h. Pylori.   EGD 2012: gastroduodenitis.  Gastric biopsies and duodenal bx done and c/w mild to moderate chronic active gastritis and mild acute duodenitis. Last EGD with biopsies also + for chronic active gastritis, no H.pylori.   Colonoscopy November 2012 (Dr. Audrie Lia) for evaluation of diarrhea. Extent of exam was to the cecum. Preparation was good. No polyps or masses were seen. Vascular ectasias noted from cecum to sigmoid both large and small in size and too numerous to count. No cautery was done.. 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 Pleasant 76 year old male with cirrhosis, presumed NASH and complicated by portal HTN. Received and reviewed records from previous GI. See above.  -will hold off on varices screening for time being.  -Recent HCC screening: normal AFP. MRI does show an abnormal area in left lobe. He is for repeat MRI in Feb.  -Recent  admission for encephalopathy and AKI. Mental status changes could have been just from AKI / dehydration vs PSE. Lactulose not started, he takes lomotil for chronic diarrhea. Discussed Xifaxan, wife doesn't want to add another medication at this point. She will call for any confusion issues.  -Already scheduled to see Dr. Ardis Hughs with labs early October and they would like to keep this appt. -continue 2 gram  sodium diet and current diuretic dose  2. Hx of chronic gastroduodenitis. Last EGD with biopsies 2016 was also + for chronic active gastritis, no H.pylori.   3. Hx of lymphocytic colitis diagnosed 2012. Stable. Takes Lomotil 1-2 times a day.  4. 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 Lesions were not bleeding, cautery not done.   Tye Savoy , NP 09/19/2016, 10:20 AM

## 2016-09-20 ENCOUNTER — Inpatient Hospital Stay: Payer: Medicare Other | Admitting: Family

## 2016-09-20 NOTE — Progress Notes (Signed)
I agree with the above note, plan 

## 2016-09-26 ENCOUNTER — Telehealth: Payer: Self-pay | Admitting: Gastroenterology

## 2016-09-26 ENCOUNTER — Other Ambulatory Visit (INDEPENDENT_AMBULATORY_CARE_PROVIDER_SITE_OTHER): Payer: Medicare Other

## 2016-09-26 DIAGNOSIS — K7460 Unspecified cirrhosis of liver: Secondary | ICD-10-CM

## 2016-09-26 LAB — HEPATIC FUNCTION PANEL
ALBUMIN: 3.5 g/dL (ref 3.5–5.2)
ALT: 26 U/L (ref 0–53)
AST: 34 U/L (ref 0–37)
Alkaline Phosphatase: 88 U/L (ref 39–117)
Bilirubin, Direct: 0.6 mg/dL — ABNORMAL HIGH (ref 0.0–0.3)
Total Bilirubin: 2.1 mg/dL — ABNORMAL HIGH (ref 0.2–1.2)
Total Protein: 7.2 g/dL (ref 6.0–8.3)

## 2016-09-26 NOTE — Telephone Encounter (Signed)
The pt has been taking aldactone 100 mg twice daily a month ago, 2 weeks ago changed to aldactone 50 mg twice daily in the ED (dehydration) Since starting aldactone he has had increasing itching.  More than he has had in the past.  No edema but still very "tired"  His behavior is completely normal today no issues with encephalopathy.  The pt's wife wants to know if aldactone may be causing the itching or is it worsening liver disease.  Please advise

## 2016-09-26 NOTE — Telephone Encounter (Signed)
That would be an unusual side effect of aldactone.  Lets get some labs today (LFTs), thanks

## 2016-09-26 NOTE — Telephone Encounter (Signed)
The pt's has been advised to bring the pt in for labs

## 2016-09-28 ENCOUNTER — Encounter: Payer: Self-pay | Admitting: Gastroenterology

## 2016-09-28 ENCOUNTER — Ambulatory Visit (INDEPENDENT_AMBULATORY_CARE_PROVIDER_SITE_OTHER): Payer: Medicare Other | Admitting: Gastroenterology

## 2016-09-28 ENCOUNTER — Encounter (INDEPENDENT_AMBULATORY_CARE_PROVIDER_SITE_OTHER): Payer: Self-pay

## 2016-09-28 VITALS — BP 106/58 | HR 76 | Ht 66.5 in | Wt 210.1 lb

## 2016-09-28 DIAGNOSIS — K7460 Unspecified cirrhosis of liver: Secondary | ICD-10-CM | POA: Diagnosis not present

## 2016-09-28 NOTE — Patient Instructions (Addendum)
Stop aldactone.  Stay on lasix 11m once daily.  Call in 7-10 days to report on your mental status, your swelling. If no better, will start lactulose.  Labs in 7 days (BMET).  Keep the October 16, 2016 at 9 am.  Normal BMI (Body Mass Index- based on height and weight) is between 23 and 30. Your BMI today is Body mass index is 33.41 kg/m. .Marland KitchenPlease consider follow up  regarding your BMI with your Primary Care Provider.

## 2016-09-28 NOTE — Progress Notes (Signed)
Review of pertinent gastrointestinal problems: 1. Cirrhosis: NASH related, established care with Dr. Ardis Hughs 08/2016  Last EGD: Sept 2016 for varices screening:  Dr. Nadara Eaton in [place], [place]. Findings: small esophageal varices and portal hypertensive gastropathy.Gastric biopsies c/w chronic gastritis with intestinal metaplasia, no h. Pylori.  EGD 2012: gastroduodenitis.  Gastric biopsies and duodenal bx done and c/w mild to moderate chronic active gastritis and mild acute duodenitis. Last EGD with biopsies also + for chronic active gastritis, no H.pylori.   Hepatoma screening: [place] 08/2016 cirrhosis, splenomegaly, no mass lesions.  CBD 10-71m, follow up MRI/MRCP showed CBD measures 563monly. "Confluent arterial phase enhancement in a 5.2 cm region of the lateral segment left hepatic lobe appears to be associated with some capsular retraction and high precontrast T2 signal intensity somewhat favoring confluent hepatic fibrosis over hepatocellular carcinoma or cholangiocarcinoma. There is also delayed phase enhancement. There can be overlap in the appearance of hese entities ; strongly consider biopsy. Other workup possibilities may include surveillance MRI in 3 months time".  Arranged recall MRI February 2019.   HPI: This is a  very pleasant [age]ear old man whom I last saw about a month ago. He was in the office last week and saw [place] Chief complaint is cirrhosis  Taking lasix 4056maily.  Taking 100m72mdactone daily.  He's been fatigued, drowsy.  He's been slow from a mental status perspective, no focal deficits.   ROS: complete GI ROS as described in HPI, all other review negative.  Constitutional:  No unintentional weight loss  Weight is up 2 pounds in 1 week (last visit here in office, same scale).   Past Medical History:  Diagnosis Date  . Arthritis   . Chicken pox   . Cirrhosis ([facility])[place]. Gallstones   . Microscopic colitis   . NASH (nonalcoholic steatohepatitis)     Past  Surgical History:  Procedure Laterality Date  . ANKLE SURGERY Right    x 2    Current Outpatient Prescriptions  Medication Sig Dispense Refill  . diphenoxylate-atropine (LOMOTIL) 2.5-0.025 MG tablet Take 1 tablet by mouth daily as needed for diarrhea or loose stools. 30 tablet 4  . furosemide (LASIX) 40 MG tablet Take 1 tablet (40 mg total) by mouth daily. 90 tablet 0  . spironolactone (ALDACTONE) 100 MG tablet Take 1 tablet (100 mg total) by mouth daily. 30 tablet 3   No current facility-administered medications for this visit.     Allergies as of 09/28/2016 - Review Complete 09/28/2016  Allergen Reaction Noted  . Entocort ec [budesonide] Itching and Rash 08/15/2016  . Penicillins Hives 07/23/2016    Family History  Problem Relation Age of Onset  . Alzheimer's disease Mother   . Rheum arthritis Mother   . Thyroid disease Mother   . Heart disease Father   . Thyroid disease Maternal Aunt   . Thyroid disease Maternal Aunt     Social History   Social History  . Marital status: Married    Spouse name: N/A  . Number of children: 3  . Years of education: 14   Occupational History  . Retired AT&TChemical engineerSocial History Main Topics  . Smoking status: Current Every Day Smoker    Packs/day: 0.50    Years: 60.00    Types: Cigarettes  . Smokeless tobacco: Never Used  . Alcohol use No  . Drug use: No  . Sexual activity: Not on file   Other Topics Concern  .  Not on file   Social History Narrative   Fun/Hobby: Fishing        Physical Exam: Ht 5' 6.5" (1.689 m)   Wt 210 lb 2 oz (95.3 kg)   BMI 33.41 kg/m  Constitutional: generally well-appearing Psychiatric: alert and oriented x3 But clearly thinking slowly. Mild asterixis Abdomen: soft, nontender, nondistended, no obvious ascites, no peritoneal signs, normal bowel sounds Trace to 1+ peripheral edema noted in lower extremities  Assessment and plan: 76 y.o. male with cirrhosis  Therapeutic  misadventure with over diuresis leading to renal insufficiency and hepatic encephalopathy. His wife was asking about stopping or cutting back the Aldactone and I agree with her now. He will stay on Lasix 40 mg once daily. She is going to call in 7-10 days to let me know about his mental status. He is still thinking slowly, being a bit forgetful with signs of encephalopathy at that point then he will start lactulose and possibly also Xifaxan twice daily. He will get a repeat set of labs including a basic metabolic profile around that same time. He'll return to see me in one month and sooner if any problems.  Please see the "Patient Instructions" section for addition details about the plan.  Owens Loffler, MD [facility] 09/28/2016, 10:15 AM

## 2016-10-05 ENCOUNTER — Other Ambulatory Visit: Payer: Self-pay

## 2016-10-05 ENCOUNTER — Telehealth: Payer: Self-pay | Admitting: Gastroenterology

## 2016-10-05 ENCOUNTER — Other Ambulatory Visit (INDEPENDENT_AMBULATORY_CARE_PROVIDER_SITE_OTHER): Payer: Medicare Other

## 2016-10-05 DIAGNOSIS — K7460 Unspecified cirrhosis of liver: Secondary | ICD-10-CM

## 2016-10-05 LAB — CBC WITH DIFFERENTIAL/PLATELET
Basophils Absolute: 0 10*3/uL (ref 0.0–0.1)
Basophils Relative: 0.6 % (ref 0.0–3.0)
EOS PCT: 5 % (ref 0.0–5.0)
Eosinophils Absolute: 0.3 10*3/uL (ref 0.0–0.7)
HCT: 37.9 % — ABNORMAL LOW (ref 39.0–52.0)
HEMOGLOBIN: 13 g/dL (ref 13.0–17.0)
LYMPHS PCT: 20.6 % (ref 12.0–46.0)
Lymphs Abs: 1.1 10*3/uL (ref 0.7–4.0)
MCHC: 34.3 g/dL (ref 30.0–36.0)
MCV: 97.9 fl (ref 78.0–100.0)
MONOS PCT: 12.5 % — AB (ref 3.0–12.0)
Monocytes Absolute: 0.7 10*3/uL (ref 0.1–1.0)
NEUTROS PCT: 61.3 % (ref 43.0–77.0)
Neutro Abs: 3.2 10*3/uL (ref 1.4–7.7)
PLATELETS: 72 10*3/uL — AB (ref 150.0–400.0)
RBC: 3.87 Mil/uL — AB (ref 4.22–5.81)
RDW: 14.8 % (ref 11.5–15.5)
WBC: 5.2 10*3/uL (ref 4.0–10.5)

## 2016-10-05 NOTE — Telephone Encounter (Signed)
Spoke to patient's wife, called to give update, since stopping the Aldactone he has significant improvement in mental clarity, balance issues back to normal, no weight gain, no lower extremity edema. Patient has even driven a vehicle last two days. Told wife that I would call her back if there were further instructions. Next office follow up on 10/16/16.

## 2016-10-08 ENCOUNTER — Other Ambulatory Visit (INDEPENDENT_AMBULATORY_CARE_PROVIDER_SITE_OTHER): Payer: Medicare Other

## 2016-10-08 DIAGNOSIS — K7460 Unspecified cirrhosis of liver: Secondary | ICD-10-CM | POA: Diagnosis not present

## 2016-10-08 LAB — BASIC METABOLIC PANEL
BUN: 14 mg/dL (ref 6–23)
CALCIUM: 8.8 mg/dL (ref 8.4–10.5)
CHLORIDE: 108 meq/L (ref 96–112)
CO2: 25 mEq/L (ref 19–32)
CREATININE: 0.92 mg/dL (ref 0.40–1.50)
GFR: 84.8 mL/min (ref >60.00)
Glucose, Bld: 107 mg/dL — ABNORMAL HIGH (ref 70–99)
Potassium: 3.5 mEq/L (ref 3.5–5.1)
Sodium: 142 mEq/L (ref 135–145)

## 2016-10-16 ENCOUNTER — Ambulatory Visit (INDEPENDENT_AMBULATORY_CARE_PROVIDER_SITE_OTHER): Payer: Medicare Other | Admitting: Gastroenterology

## 2016-10-16 ENCOUNTER — Encounter: Payer: Self-pay | Admitting: Gastroenterology

## 2016-10-16 ENCOUNTER — Ambulatory Visit: Payer: Medicare Other | Admitting: Gastroenterology

## 2016-10-16 VITALS — BP 98/50 | HR 68 | Ht 67.0 in | Wt 227.0 lb

## 2016-10-16 DIAGNOSIS — K7460 Unspecified cirrhosis of liver: Secondary | ICD-10-CM

## 2016-10-16 NOTE — Patient Instructions (Addendum)
Continue lasix at 64m per day.  Restart aldactone at 565mper day.  No new prescription needed.  Med list updated.  BMET in one week, will check on weight changes at that point.  Hope is to at least NOT GAIN ANY MORE WEIGHT.   Normal BMI (Body Mass Index- based on height and weight) is between 23 and 30. Your BMI today is Body mass index is 35.55 kg/m. . Marland Kitchenlease consider follow up  regarding your BMI with your Primary Care Provider.  rov in 6-8 weeks.

## 2016-10-16 NOTE — Progress Notes (Signed)
Review of pertinent gastrointestinal problems: 1. Cirrhosis: NASH related, established care with Dr. Ardis Hughs 08/2016  Last EGD: Sept 2016 for varices screening: Dr. Nadara Eaton in [facility]. Findings:small esophageal varices and portal hypertensive gastropathy.Gastric biopsies c/w chronic gastritis with intestinal metaplasia, no h. Pylori.  EGD 2012: gastroduodenitis. Gastric biopsies and duodenal bx done and c/wmild to moderate chronic active gastritis andmild acute duodenitis. Last EGD with biopsies also + for chronic active gastritis, no H.pylori.   Hepatoma screening: [place] 08/2016 cirrhosis, splenomegaly, no mass lesions.  CBD 10-16m, follow up MRI/MRCP showed CBD measures 511monly. "Confluent arterial phase enhancement in a 5.2 cm region of the lateral segment left hepatic lobe appears to be associated with some capsular retraction and high precontrast T2 signal intensity somewhat favoring confluent hepatic fibrosis over hepatocellular carcinoma or cholangiocarcinoma. There is also delayed phase enhancement. There can be overlap in the appearance of hese entities ; strongly consider biopsy. Other workup possibilities may include surveillance MRI in 3 months time".  Arranged recall MRI February 2019.  Mild pitting edema, very sensitive to diuresis (08/2016 overdiuresis caused overt HE).   HPI: This is a very pleasant [age]ear old man whom I last saw about a month ago. He is here with his wife again today.  At the last visit we decided to get him back to his Lasix monotherapy diuretic regimen.  Chief complaint is decompensated cirrhosis  He's been putting on weight gradually.  He is having no breathing difficulty  ROS: complete GI ROS as described in HPI, all other review negative.  Constitutional:  No unintentional weight loss  Up 17 pounds since last seen her in the office 2-3 weeks ago (same scale)   the medicine list below is actually not correct he is actually not on  spironolactone   Past Medical History:  Diagnosis Date  . Arthritis   . Chicken pox   [place]  . Gallstones   . Microscopic colitis   . NASH (nonalcoholic steatohepatitis)     Past Surgical History:  Procedure Laterality Date  . ANKLE SURGERY Right    x 2    Current Outpatient Prescriptions  Medication Sig Dispense Refill  . diphenoxylate-atropine (LOMOTIL) 2.5-0.025 MG tablet Take 1 tablet by mouth daily as needed for diarrhea or loose stools. 30 tablet 4  . furosemide (LASIX) 40 MG tablet Take 1 tablet (40 mg total) by mouth daily. 90 tablet 0  . spironolactone (ALDACTONE) 100 MG tablet Take 1 tablet (100 mg total) by mouth daily. 30 tablet 3   No current facility-administered medications for this visit.     Allergies as of 10/16/2016 - Review Complete 10/16/2016  Allergen Reaction Noted  . Entocort ec [budesonide] Itching and Rash 08/15/2016  . Penicillins Hives 07/23/2016    Family History  Problem Relation Age of Onset  . Alzheimer's disease Mother   . Rheum arthritis Mother   . Thyroid disease Mother   . Heart disease Father   . Thyroid disease Maternal Aunt   . Thyroid disease Maternal Aunt     Social History   Social History  . Marital status: Married    Spouse name: N/A  . Number of children: 3  . Years of education: 14   Occupational History  . Retired ATChemical engineer  Social History Main Topics  . Smoking status: Current Every Day Smoker    Packs/day: 0.50    Years: 60.00    Types: Cigarettes  . Smokeless tobacco: Never Used  .  Alcohol use No  . Drug use: No  . Sexual activity: Not on file   Other Topics Concern  . Not on file   Social History Narrative   Fun/Hobby: Fishing        Physical Exam: BP (!) 98/50   Pulse 68   Ht 5' 7"  (1.702 m)   Wt 227 lb (103 kg)   BMI 35.55 kg/m  Constitutional: generally well-appearing Psychiatric: alert and oriented x3 Lungs clear to auscultation bilaterally Abdomen: soft,  nontender, nondistended, Mild ascites, no peritoneal signs, normal bowel sounds 2+ pitting edema in his lower extremities  Assessment and plan: 76 y.o. male with decompensated cirrhosis  First, he is putting on fluid weight again. He has pitting edema in his legs. He is starting to get mild ascites again. Fortunately he has no breathing difficulty and has a normal lung examination.  He and his wife are very good about avoiding extra salt in his diet, he does not take NSAIDs. His mental status is back to perfectly normal. I explained that we need to restart diuretic Aldactone and hopefully can at least keep him from putting on any more fluid weight. Given his recent overdiuresis and resultant hepatic encephalopathy we are going to start very slow with Aldactone at 50 mg once daily. He will have a repeat set of labs in 1 week from today, be met, after I see that result we will contact him and get an update on his weights.  Please see the "Patient Instructions" section for addition details about the plan.  Owens Loffler, MD [facility] 10/16/2016, 9:10 AM

## 2016-10-19 ENCOUNTER — Telehealth: Payer: Self-pay | Admitting: Gastroenterology

## 2016-10-19 NOTE — Telephone Encounter (Signed)
The pt has been advised to come in next week for BMET to evaluate diuretic dose.

## 2016-10-22 ENCOUNTER — Other Ambulatory Visit (INDEPENDENT_AMBULATORY_CARE_PROVIDER_SITE_OTHER): Payer: Medicare Other

## 2016-10-22 DIAGNOSIS — K7460 Unspecified cirrhosis of liver: Secondary | ICD-10-CM | POA: Diagnosis not present

## 2016-10-22 LAB — BASIC METABOLIC PANEL
BUN: 11 mg/dL (ref 6–23)
CO2: 25 meq/L (ref 19–32)
Calcium: 8.9 mg/dL (ref 8.4–10.5)
Chloride: 106 mEq/L (ref 96–112)
Creatinine, Ser: 0.92 mg/dL (ref 0.40–1.50)
GFR: 84.79 mL/min (ref >60.00)
GLUCOSE: 103 mg/dL — AB (ref 70–99)
POTASSIUM: 4 meq/L (ref 3.5–5.1)
SODIUM: 135 meq/L (ref 135–145)

## 2016-10-23 ENCOUNTER — Other Ambulatory Visit: Payer: Self-pay

## 2016-10-23 DIAGNOSIS — K7469 Other cirrhosis of liver: Secondary | ICD-10-CM

## 2016-10-31 ENCOUNTER — Telehealth: Payer: Self-pay | Admitting: Gastroenterology

## 2016-10-31 NOTE — Telephone Encounter (Signed)
The pt has been advised of the aldactone decrease to 50 mg every other day.  He will have labs next week and will call in 1 week with an update.

## 2016-10-31 NOTE — Telephone Encounter (Signed)
The pt's wife states that the pt has been taking aldactone to 50 mg daily. Weight has been decreasing.  However, over the last week the pt has developed sore breast and can not sleep.  The pt's wife states that is a side effect of the medication on the insert. She says he is sore.  not painful but a discomfort.  Should he continue?

## 2016-10-31 NOTE — Telephone Encounter (Signed)
Definitely could be side effects.  Can you confirm his dose? I thought he was taking 58m aldactone once daily.

## 2016-10-31 NOTE — Telephone Encounter (Signed)
Ok, lets decrease to taking aldactone 33m pill every other day.  She should still bring him for BMET next week, [place] on [place] breast tenderness, weight loss progress.

## 2016-10-31 NOTE — Telephone Encounter (Signed)
Yes, it is 50 mg aldactone once daily.

## 2016-11-05 ENCOUNTER — Other Ambulatory Visit (INDEPENDENT_AMBULATORY_CARE_PROVIDER_SITE_OTHER): Payer: Medicare Other

## 2016-11-05 DIAGNOSIS — K7469 Other cirrhosis of liver: Secondary | ICD-10-CM

## 2016-11-05 LAB — BASIC METABOLIC PANEL
BUN: 16 mg/dL (ref 6–23)
CHLORIDE: 104 meq/L (ref 96–112)
CO2: 28 mEq/L (ref 19–32)
CREATININE: 0.93 mg/dL (ref 0.40–1.50)
Calcium: 9.5 mg/dL (ref 8.4–10.5)
GFR: 83.73 mL/min (ref >60.00)
Glucose, Bld: 91 mg/dL (ref 70–99)
Potassium: 4.3 mEq/L (ref 3.5–5.1)
Sodium: 139 mEq/L (ref 135–145)

## 2016-11-16 ENCOUNTER — Telehealth: Payer: Self-pay | Admitting: Gastroenterology

## 2016-11-16 DIAGNOSIS — K7460 Unspecified cirrhosis of liver: Secondary | ICD-10-CM

## 2016-11-16 MED ORDER — AMILORIDE HCL 5 MG PO TABS: 5.0000 mg | ORAL_TABLET | Freq: Every day | ORAL | 5 refills | Status: DC

## 2016-11-16 NOTE — Telephone Encounter (Signed)
Okay, please tell him to stop the Spironolactone  I would like him to be on amiloride 5 mg pills 1 pill once daily.  Dispense 30 pills with 5 refills.  This will take the place of the Spironolactone and it does not cause breast issues.Marland Kitchen  He will need a basic metabolic profile in 2 weeks.

## 2016-11-16 NOTE — Telephone Encounter (Signed)
The pts wife states that the spironolactone has caused his breast to become sore and one is bigger and harder than the other.  Please advise.  Should he stop?

## 2016-11-16 NOTE — Telephone Encounter (Signed)
The pt's wife has been advised to stop the spironolactone and will pick up new script. She will bring him in 2 weeks for labs

## 2016-11-16 NOTE — Telephone Encounter (Signed)
He did decrease the dose as recommended but has continued to develop symptoms.

## 2016-11-23 ENCOUNTER — Ambulatory Visit: Payer: Medicare Other | Admitting: Gastroenterology

## 2016-11-26 ENCOUNTER — Telehealth: Payer: Self-pay | Admitting: Gastroenterology

## 2016-11-26 NOTE — Telephone Encounter (Signed)
The pt's wife will bring the pt in for labs on Friday due to the change in medication.  The labs from [place] will still be faxed with office notes for review

## 2016-11-30 ENCOUNTER — Other Ambulatory Visit (INDEPENDENT_AMBULATORY_CARE_PROVIDER_SITE_OTHER): Payer: Medicare Other

## 2016-11-30 DIAGNOSIS — K7460 Unspecified cirrhosis of liver: Secondary | ICD-10-CM

## 2016-11-30 LAB — BASIC METABOLIC PANEL
BUN: 11 mg/dL (ref 6–23)
CHLORIDE: 106 meq/L (ref 96–112)
CO2: 26 meq/L (ref 19–32)
CREATININE: 0.88 mg/dL (ref 0.40–1.50)
Calcium: 9 mg/dL (ref 8.4–10.5)
GFR: 89.23 mL/min (ref >60.00)
Glucose, Bld: 98 mg/dL (ref 70–99)
POTASSIUM: 3.8 meq/L (ref 3.5–5.1)
Sodium: 138 mEq/L (ref 135–145)

## 2016-12-12 ENCOUNTER — Encounter: Payer: Self-pay | Admitting: Gastroenterology

## 2016-12-12 ENCOUNTER — Ambulatory Visit (INDEPENDENT_AMBULATORY_CARE_PROVIDER_SITE_OTHER): Payer: Medicare Other | Admitting: Gastroenterology

## 2016-12-12 ENCOUNTER — Other Ambulatory Visit (INDEPENDENT_AMBULATORY_CARE_PROVIDER_SITE_OTHER): Payer: Medicare Other

## 2016-12-12 VITALS — BP 112/70 | HR 80 | Ht 66.5 in | Wt 223.8 lb

## 2016-12-12 DIAGNOSIS — K7460 Unspecified cirrhosis of liver: Secondary | ICD-10-CM

## 2016-12-12 DIAGNOSIS — K7581 Nonalcoholic steatohepatitis (NASH): Secondary | ICD-10-CM | POA: Diagnosis not present

## 2016-12-12 LAB — BASIC METABOLIC PANEL
BUN: 12 mg/dL (ref 6–23)
CALCIUM: 9 mg/dL (ref 8.4–10.5)
CHLORIDE: 108 meq/L (ref 96–112)
CO2: 24 mEq/L (ref 19–32)
CREATININE: 0.91 mg/dL (ref 0.40–1.50)
GFR: 85.84 mL/min (ref >60.00)
Glucose, Bld: 100 mg/dL — ABNORMAL HIGH (ref 70–99)
Potassium: 4.2 mEq/L (ref 3.5–5.1)
Sodium: 138 mEq/L (ref 135–145)

## 2016-12-12 NOTE — Progress Notes (Signed)
Review of pertinent gastrointestinal problems: 1. Cirrhosis: NASH related, established care with Dr. Ardis Hughs 08/2016  Last SHN:GITJ 2016 for varices screening: Dr. Nadara Eaton in [facility]. Findings:small esophageal varices and portal hypertensive gastropathy.Gastric biopsies c/w chronic gastritis with intestinal metaplasia, no h. Pylori. EGD 2012: gastroduodenitis. Gastric biopsies and duodenal bx done and c/wmild to moderate chronic active gastritis andmild acute duodenitis. Last EGD with biopsies also + for chronic active gastritis, no H.pylori.   EGD August 2016 outside showed portal hypertensive gastropathy, esophageal varices.  Hepatoma screening:[place] 08[place] cirrhosis, splenomegaly, no mass lesions. CBD 10-97m, follow up MRI/MRCP showed CBD measures 5138monly. "Confluent arterial phase enhancement in a 5.2 cm region of the lateral segment left hepatic lobe appears to be associated with some capsular retraction and high precontrast T2 signal intensity somewhat favoring confluent hepatic fibrosis over hepatocellular carcinoma or cholangiocarcinoma. There is also delayed phase enhancement. There can be overlap in the appearance of hese entities ; strongly consider biopsy. Other workuppossibilities may include surveillance MRI in 3 months time". Arranged recall MRI February 2019.  [place] repeated MRI November 2017.  I do not have access to that report but his hepatologist said it looked okay and they arranged for repeat MRI in [place] May 2019.  I will leave all further hepatoma screening to them.  Mild pitting edema, very sensitive to diuresis (08/2016 overdiuresis caused overt HE).  HPI: This is a pleasant [age]ear old man who is here with his wife  Chief complaint is cirrhosis   His last visit here was about 6 weeks ago.   he was starting to put on fluid weight again on Lasix monotherapy and so I restarted  low-dose Spironolactone.  Unfortunately had a lot of breast tenderness with this and so eventually exchanged it for amiloride 5 mg pills 1 pill once daily. He has also been in a lot of contact with his Previous gastroenterologist office and actually saw him again earlier this month.  It looks like he is going to be following with them periodically as well as me as his local gastroenterologist.  He actually never started the amiloride which I recommended until it was okayed by that doctor.  He started amiloride 38m70maily.  Lasix is 48m738mce daily.  He was only on the amiloride for possibly 2 or 3 days before repeat labs here.  I reviewed his other gastroenterologist note.  It looks like they repeated an MRI at their facility "the images showed cirrhosis but no evidence of hepatoma" they are planning to repeat MRI in 6 months.  His weight is down 4 pounds since last visit.  ROS: complete GI ROS as described in HPI, all other review negative.  Constitutional:  No unintentional weight loss   Past Medical History:  Diagnosis Date  . Arthritis   . Chicken pox   . Cirrhosis (H[facility]. Gallstones   . Microscopic colitis   . NASH (nonalcoholic steatohepatitis)     Past Surgical History:  Procedure Laterality Date  . ANKLE SURGERY Right    x 2    Current Outpatient Medications  Medication Sig Dispense Refill  . aMILoride (MIDAMOR) 5 MG tablet Take 1 tablet (5 mg total) by mouth daily. 30 tablet 5  . diphenoxylate-atropine (LOMOTIL) 2.5-0.025 MG tablet Take 1 tablet by mouth daily as needed for diarrhea or loose stools. 30 tablet 4  . furosemide (LASIX) 40 MG tablet Take 1 tablet (  40 mg total) by mouth daily. 90 tablet 0   No current facility-administered medications for this visit.     Allergies as of 12/12/2016 - Review Complete 12/12/2016  Allergen Reaction Noted  . Entocort ec [budesonide] Itching and Rash 08/15/2016  . Penicillins Hives 07/23/2016  . Spironolactone  11/16/2016     Family History  Problem Relation Age of Onset  . Alzheimer's disease Mother   . Rheum arthritis Mother   . Thyroid disease Mother   . Heart disease Father   . Thyroid disease Maternal Aunt   . Thyroid disease Maternal Aunt     Social History   Socioeconomic History  . Marital status: Married    Spouse name: Not on file  . Number of children: 3  . Years of education: 40  . Highest education level: Not on file  Social Needs  . Financial resource strain: Not on file  . Food insecurity - worry: Not on file  . Food insecurity - inability: Not on file  . Transportation needs - medical: Not on file  . Transportation needs - non-medical: Not on file  Occupational History  . Occupation: Retired Chemical engineer  Tobacco Use  . Smoking status: Current Every Day Smoker    Packs/day: 0.50    Years: 60.00    Pack years: 30.00    Types: Cigarettes  . Smokeless tobacco: Never Used  Substance and Sexual Activity  . Alcohol use: No  . Drug use: No  . Sexual activity: Not on file  Other Topics Concern  . Not on file  Social History Narrative   Fun/Hobby: Fishing     Physical Exam: BP 112/70   Pulse 80   Ht 5' 6.5" (1.689 m)   Wt 223 lb 12.8 oz (101.5 kg)   BMI 35.58 kg/m  Constitutional: generally well-appearing Psychiatric: alert and oriented x3 Abdomen: soft, nontender, nondistended, no obvious ascites, no peritoneal signs, normal bowel sounds Trace pitting edema noted in lower extremities  Assessment and plan: 76 y.o. male with cirrhosis decompensated  I was not aware of this but it looks like he is traveling back and forth to [place] to continue care with his hepatology team there.  I will be acting as his local gastroenterologist.  I am happy to have their guidance.  It looks like they will be assuming all of his hepatoma screening going forward.  He needs repeat labs today including a basic metabolic profile to see how he has responded to the  amiloride.  He needs an EGD for variceal surveillance, his last one was over 2 years ago.  He will return to see me in 2-3 months, sooner if needed.  Please see the "Patient Instructions" section for addition details about the plan.  Owens Loffler, MD [facility] 12/12/2016, 9:15 AM

## 2016-12-12 NOTE — Patient Instructions (Addendum)
  You have been scheduled for an endoscopy. Please follow written instructions given to you at your visit today. If you use inhalers (even only as needed), please bring them with you on the day of your procedure.  You will have labs checked today in the basement lab.  Please head down after you check out with the front desk  (bmet).  Please return to see Dr. Ardis Hughs in 2-3 months.  Please call to set appointment the schedule is not out for 2-3 months.    Normal BMI (Body Mass Index- based on height and weight) is between 23 and 30. Your BMI today is Body mass index is 35.58 kg/m. Marland Kitchen Please consider follow up  regarding your BMI with your Primary Care Provider.

## 2016-12-18 ENCOUNTER — Telehealth: Payer: Self-pay | Admitting: Gastroenterology

## 2016-12-19 MED ORDER — FUROSEMIDE 40 MG PO TABS: 40.0000 mg | ORAL_TABLET | Freq: Every day | ORAL | 3 refills | Status: DC

## 2016-12-19 MED ORDER — AMILORIDE HCL 5 MG PO TABS: 5.0000 mg | ORAL_TABLET | Freq: Every day | ORAL | 3 refills | Status: AC

## 2016-12-19 MED ORDER — DIPHENOXYLATE-ATROPINE 2.5-0.025 MG PO TABS: 1.0000 | ORAL_TABLET | Freq: Every day | ORAL | 3 refills | Status: DC | PRN

## 2016-12-19 NOTE — Telephone Encounter (Signed)
Medications (Lomotil, furosemide, amiloride) sent in to [facility] for 90 day supply. Lomotil and furosemide printed and had to be faxed in.

## 2016-12-20 ENCOUNTER — Telehealth: Payer: Self-pay | Admitting: Gastroenterology

## 2016-12-20 MED ORDER — FUROSEMIDE 40 MG PO TABS: 40.0000 mg | ORAL_TABLET | Freq: Every day | ORAL | 3 refills | Status: AC

## 2016-12-20 MED ORDER — DIPHENOXYLATE-ATROPINE 2.5-0.025 MG PO TABS
1.0000 | ORAL_TABLET | Freq: Every day | ORAL | 3 refills | Status: AC | PRN
Start: 2016-12-20 — End: ?

## 2016-12-20 NOTE — Telephone Encounter (Signed)
The pt appt has been rescheduled from 12/20 to 1/10 he has been reinstructed

## 2017-01-10 ENCOUNTER — Encounter (HOSPITAL_COMMUNITY): Payer: Self-pay

## 2017-01-16 ENCOUNTER — Encounter (HOSPITAL_COMMUNITY): Payer: Self-pay | Admitting: *Deleted

## 2017-01-16 ENCOUNTER — Other Ambulatory Visit: Payer: Self-pay

## 2017-01-21 ENCOUNTER — Ambulatory Visit: Payer: Medicare Other | Admitting: Nurse Practitioner

## 2017-01-24 ENCOUNTER — Ambulatory Visit (HOSPITAL_COMMUNITY): Payer: Medicare Other | Admitting: Registered Nurse

## 2017-01-24 ENCOUNTER — Encounter (HOSPITAL_COMMUNITY): Payer: Self-pay | Admitting: Gastroenterology

## 2017-01-24 ENCOUNTER — Encounter (HOSPITAL_COMMUNITY)
Admission: RE | Disposition: A | Discharge status: Home / Self Care | Payer: Self-pay | Source: Ambulatory Visit | Attending: Gastroenterology

## 2017-01-24 ENCOUNTER — Ambulatory Visit (HOSPITAL_COMMUNITY)
Admission: RE | Admit: 2017-01-24 | Discharge: 2017-01-24 | Disposition: A | Discharge status: Home / Self Care | Payer: Medicare Other | Source: Ambulatory Visit | Attending: Gastroenterology | Admitting: Gastroenterology

## 2017-01-24 ENCOUNTER — Other Ambulatory Visit: Payer: Self-pay

## 2017-01-24 DIAGNOSIS — Z880 Allergy status to penicillin: Secondary | ICD-10-CM | POA: Diagnosis not present

## 2017-01-24 DIAGNOSIS — K2970 Gastritis, unspecified, without bleeding: Secondary | ICD-10-CM

## 2017-01-24 DIAGNOSIS — K766 Portal hypertension: Secondary | ICD-10-CM | POA: Insufficient documentation

## 2017-01-24 DIAGNOSIS — K7460 Unspecified cirrhosis of liver: Secondary | ICD-10-CM | POA: Diagnosis not present

## 2017-01-24 DIAGNOSIS — K2990 Gastroduodenitis, unspecified, without bleeding: Secondary | ICD-10-CM

## 2017-01-24 DIAGNOSIS — I8510 Secondary esophageal varices without bleeding: Secondary | ICD-10-CM | POA: Insufficient documentation

## 2017-01-24 DIAGNOSIS — M1990 Unspecified osteoarthritis, unspecified site: Secondary | ICD-10-CM | POA: Diagnosis not present

## 2017-01-24 DIAGNOSIS — K2950 Unspecified chronic gastritis without bleeding: Secondary | ICD-10-CM | POA: Diagnosis not present

## 2017-01-24 DIAGNOSIS — K7581 Nonalcoholic steatohepatitis (NASH): Secondary | ICD-10-CM | POA: Insufficient documentation

## 2017-01-24 DIAGNOSIS — Z09 Encounter for follow-up examination after completed treatment for conditions other than malignant neoplasm: Secondary | ICD-10-CM | POA: Diagnosis present

## 2017-01-24 DIAGNOSIS — Z888 Allergy status to other drugs, medicaments and biological substances status: Secondary | ICD-10-CM | POA: Insufficient documentation

## 2017-01-24 DIAGNOSIS — Z79899 Other long term (current) drug therapy: Secondary | ICD-10-CM | POA: Diagnosis not present

## 2017-01-24 DIAGNOSIS — I10 Essential (primary) hypertension: Secondary | ICD-10-CM | POA: Insufficient documentation

## 2017-01-24 DIAGNOSIS — K3189 Other diseases of stomach and duodenum: Secondary | ICD-10-CM | POA: Diagnosis not present

## 2017-01-24 DIAGNOSIS — I8500 Esophageal varices without bleeding: Secondary | ICD-10-CM

## 2017-01-24 DIAGNOSIS — F17210 Nicotine dependence, cigarettes, uncomplicated: Secondary | ICD-10-CM | POA: Insufficient documentation

## 2017-01-24 HISTORY — PX: ESOPHAGOGASTRODUODENOSCOPY (EGD) WITH PROPOFOL: SHX5813

## 2017-01-24 SURGERY — ESOPHAGOGASTRODUODENOSCOPY (EGD) WITH PROPOFOL
Anesthesia: Monitor Anesthesia Care

## 2017-01-24 MED ORDER — PROPOFOL 10 MG/ML IV BOLUS
INTRAVENOUS | Status: DC | PRN
  Administered 2017-01-24 (×3): 30 mg via INTRAVENOUS

## 2017-01-24 MED ORDER — PROPOFOL 10 MG/ML IV BOLUS
INTRAVENOUS | Status: AC
Start: 2017-01-24 — End: ?
  Filled 2017-01-24: qty 60

## 2017-01-24 MED ORDER — SODIUM CHLORIDE 0.9 % IV SOLN: INTRAVENOUS | Status: DC

## 2017-01-24 MED ORDER — LIDOCAINE 2% (20 MG/ML) 5 ML SYRINGE
INTRAMUSCULAR | Status: DC | PRN
  Administered 2017-01-24: 60 mg via INTRAVENOUS

## 2017-01-24 MED ORDER — LACTATED RINGERS IV SOLN
INTRAVENOUS | Status: DC
  Administered 2017-01-24: 08:00:00 via INTRAVENOUS

## 2017-01-24 MED ORDER — SIMETHICONE 40 MG/0.6ML PO SUSP
ORAL | Status: AC
  Filled 2017-01-24: qty 0.6

## 2017-01-24 SURGICAL SUPPLY — 15 items

## 2017-01-24 NOTE — Transfer of Care (Signed)
Immediate Anesthesia Transfer of Care Note  Patient: Daniel Lambert  Procedure(s) Performed: ESOPHAGOGASTRODUODENOSCOPY (EGD) WITH PROPOFOL (N/A )  Patient Location: PACU  Anesthesia Type:MAC  Level of Consciousness: sedated  Airway & Oxygen Therapy: Patient Spontanous Breathing and Patient connected to nasal cannula oxygen  Post-op Assessment: Report given to RN and Post -op Vital signs reviewed and stable  Post vital signs: Reviewed and stable  Last Vitals:  Vitals:   01/24/17 0733  BP: 128/61  Pulse: 82  Resp: 20  Temp: 36.7 C  SpO2: 97%    Last Pain:  Vitals:   01/24/17 0733  TempSrc: Oral         Complications: No apparent anesthesia complications

## 2017-01-24 NOTE — Anesthesia Preprocedure Evaluation (Signed)
Anesthesia Evaluation  Patient identified by MRN, date of birth, ID band Patient awake    Reviewed: Allergy & Precautions, H&P , NPO status , Patient's Chart, lab work & pertinent test results  Airway Mallampati: II   Neck ROM: full    Dental   Pulmonary Current Smoker,    breath sounds clear to auscultation       Cardiovascular hypertension,  Rhythm:regular Rate:Normal     Neuro/Psych    GI/Hepatic (+) Hepatitis -NASH   Endo/Other    Renal/GU      Musculoskeletal  (+) Arthritis ,   Abdominal   Peds  Hematology   Anesthesia Other Findings   Reproductive/Obstetrics                             Anesthesia Physical Anesthesia Plan  ASA: III  Anesthesia Plan: MAC   Post-op Pain Management:    Induction: Intravenous  PONV Risk Score and Plan: 0 and Propofol infusion, Ondansetron and Treatment may vary due to age or medical condition  Airway Management Planned: Nasal Cannula  Additional Equipment:   Intra-op Plan:   Post-operative Plan:   Informed Consent: I have reviewed the patients History and Physical, chart, labs and discussed the procedure including the risks, benefits and alternatives for the proposed anesthesia with the patient or authorized representative who has indicated his/her understanding and acceptance.     Plan Discussed with: CRNA, Anesthesiologist and Surgeon  Anesthesia Plan Comments:         Anesthesia Quick Evaluation

## 2017-01-24 NOTE — Discharge Instructions (Signed)

## 2017-01-24 NOTE — Op Note (Signed)
[facility] Patient Name: Daniel Lambert Procedure Date: 01/24/2017 MRN: [id number] Attending MD: Milus Banister , MD Date of Birth: 1940/04/13 CSN: [id number] Age: 77 Admit Type: Outpatient Procedure:                Upper GI endoscopy Indications:              Surveillance procedure, Follow-up of esophageal                            varices;  Last JFH:LKTG 2016 for varices                            screening: Dr. Nadara Eaton in [place].                            Findings:small esophageal varices and portal                            hypertensive gastropathy.Gastric biopsies c/w                            chronic gastritis with intestinal metaplasia, no h.                            Pylori. Providers:                Milus Banister, MD, Kingsley Plan, RN, Presley Raddle, RN, Cherylynn Ridges, Technician, Marla Roe, CRNA Referring MD:              Medicines:                Monitored Anesthesia Care Complications:            No immediate complications. Estimated blood loss:                            None. Estimated Blood Loss:     Estimated blood loss: none. Procedure:                Pre-Anesthesia Assessment:                           - Prior to the procedure, a History and Physical                            was performed, and patient medications and                            allergies were reviewed. The patient's tolerance of                            previous anesthesia was also reviewed. The risks  and benefits of the procedure and the sedation                            options and risks were discussed with the patient.                            All questions were answered, and informed consent                            was obtained. Prior Anticoagulants: The patient has                            taken no previous anticoagulant or antiplatelet                       agents. ASA Grade Assessment: III - A patient with                            severe systemic disease. After reviewing the risks                            and benefits, the patient was deemed in                            satisfactory condition to undergo the procedure.                           After obtaining informed consent, the endoscope was                            passed under direct vision. Throughout the                            procedure, the patient's blood pressure, pulse, and                            oxygen saturations were monitored continuously. The                            [id number]) scope was introduced through the                            mouth, and advanced to the second part of duodenum.                            The upper GI endoscopy was accomplished without                            difficulty. The patient tolerated the procedure                            well. Scope In: Scope Out: Findings:      Diminutive vs. Grade I varices were found in the lower third of the  esophagus.      No gastric varices.      There was typical appearing, mild portal gastropathy in the proximal to       mid stomach.      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      The mucosa at the pylorus and just proximal to the pylorus was irregular       appearing, slightly neoplastic looking for about 1.5cm. This was not       firm. Multiple biopsies were taken and sent to pathology.      Mild non-specific bulbar duodenitis.      The exam was otherwise without abnormality. Impression:               - Very small distal esophagus varices.                           - No gastric varices.                           - Mild portal gastropathy, proximally.                           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                           - Irregular mucosa at the pylorus, biopsied. Await                            path report. Moderate  Sedation:      N/A- Per Anesthesia Care Recommendation:           - Patient has a contact number available for                            emergencies. The signs and symptoms of potential                            delayed complications were discussed with the                            patient. Return to normal activities tomorrow.                            Written discharge instructions were provided to the                            patient.                           - Resume previous diet.                           - Continue present medications.                           - Await pathology results.                           -  Repeat upper endoscopy in 2 years for                            surveillance. Procedure Code(s):        --- Professional ---                           [phone], Esophagogastroduodenoscopy, flexible,                            transoral; with biopsy, single or multiple Diagnosis Code(s):        --- Professional ---                           I85.00, Esophageal varices without bleeding                           K29.70, Gastritis, unspecified, without bleeding CPT copyright 2016 American Medical Association. All rights reserved. The codes documented in this report are preliminary and upon coder review may  be revised to meet current compliance requirements. Milus Banister, MD 01/24/2017 9:05:41 AM This report has been signed electronically. Number of Addenda: 0

## 2017-01-24 NOTE — H&P (Signed)
  HPI: This is a 77 yo man with cirrhosis  Chief complaint is cirrhosis  For variceal surveillance  ROS: complete GI ROS as described in HPI, all other review negative.  Constitutional:  No unintentional weight loss   Past Medical History:  Diagnosis Date  . Arthritis   . Cirrhosis ([place])   . Gallstones    INACTIVE  . Microscopic colitis   . NASH (nonalcoholic steatohepatitis)     Past Surgical History:  Procedure Laterality Date  . ANKLE SURGERY Right    x 2  . COLONSCOPY     X 6  . MULTIPLE TOOTH EXTRACTIONS    . UPPER GI ENDOSCOPY     X 6    No current facility-administered medications for this encounter.     Allergies as of 12/12/2016 - Review Complete 12/12/2016  Allergen Reaction Noted  . Entocort ec [budesonide] Itching and Rash 08/15/2016  . Penicillins Hives 07/23/2016  . Spironolactone  11/16/2016    Family History  Problem Relation Age of Onset  . Alzheimer's disease Mother   . Rheum arthritis Mother   . Thyroid disease Mother   . Heart disease Father   . Thyroid disease Maternal Aunt   . Thyroid disease Maternal Aunt     Social History   Socioeconomic History  . Marital status: Married    Spouse name: Not on file  . Number of children: 3  . Years of education: 26  . Highest education level: Not on file  Social Needs  . Financial resource strain: Not on file  . Food insecurity - worry: Not on file  . Food insecurity - inability: Not on file  . Transportation needs - medical: Not on file  . Transportation needs - non-medical: Not on file  Occupational History  . Occupation: Retired Chemical engineer  Tobacco Use  . Smoking status: Current Every Day Smoker    Packs/day: 0.50    Years: 60.00    Pack years: 30.00    Types: Cigarettes  . Smokeless tobacco: Never Used  Substance and Sexual Activity  . Alcohol use: No  . Drug use: No  . Sexual activity: Not on file  Other Topics Concern  . Not on file  Social History Narrative   Fun/Hobby: Fishing     Physical Exam: There were no vitals taken for this visit. Constitutional: generally well-appearing Psychiatric: alert and oriented x3 Abdomen: soft, nontender, nondistended, no obvious ascites, no peritoneal signs, normal bowel sounds No peripheral edema noted in lower extremities  Assessment and plan: 77 y.o. male with cirrhosis  For EGD today for variceal screening, surveillance.  Please see the "Patient Instructions" section for addition details about the plan.  Owens Loffler, MD [facility] 01/24/2017, 7:29 AM

## 2017-01-24 NOTE — Anesthesia Postprocedure Evaluation (Signed)
Anesthesia Post Note  Patient: Daniel Lambert  Procedure(s) Performed: ESOPHAGOGASTRODUODENOSCOPY (EGD) WITH PROPOFOL (N/A )     Patient location during evaluation: PACU Anesthesia Type: MAC Level of consciousness: awake and alert Pain management: pain level controlled Vital Signs Assessment: post-procedure vital signs reviewed and stable Respiratory status: spontaneous breathing, nonlabored ventilation, respiratory function stable and patient connected to nasal cannula oxygen Cardiovascular status: stable and blood pressure returned to baseline Postop Assessment: no apparent nausea or vomiting Anesthetic complications: no    Last Vitals:  Vitals:   01/24/17 0920 01/24/17 0925  BP: 122/68 (!) 113/58  Pulse: 75 71  Resp: (!) 21 19  Temp:    SpO2: 98% 97%    Last Pain:  Vitals:   01/24/17 0858  TempSrc: Oral                 Sayde Lish S

## 2017-01-25 ENCOUNTER — Encounter (HOSPITAL_COMMUNITY): Payer: Self-pay | Admitting: Gastroenterology

## 2017-03-06 ENCOUNTER — Telehealth: Payer: Self-pay

## 2017-03-06 NOTE — Telephone Encounter (Signed)
-----   Message from Jeoffrey Massed, RN sent at 09/03/2016 10:22 AM EDT ----- repeat MRI in 6 months (hepatoma screening, follow irregular liver changes as well).

## 2017-03-07 NOTE — Telephone Encounter (Signed)
The pt is having a repeat MRI May 6 with Dr Craige Cotta in [facility], he states the MD their is following his liver issues.

## 2017-03-26 ENCOUNTER — Encounter: Payer: Self-pay | Admitting: Gastroenterology

## 2017-03-26 ENCOUNTER — Ambulatory Visit (INDEPENDENT_AMBULATORY_CARE_PROVIDER_SITE_OTHER): Payer: Medicare Other | Admitting: Gastroenterology

## 2017-03-26 ENCOUNTER — Other Ambulatory Visit (INDEPENDENT_AMBULATORY_CARE_PROVIDER_SITE_OTHER): Payer: Medicare Other

## 2017-03-26 VITALS — BP 104/56 | HR 58 | Ht 66.5 in | Wt 232.0 lb

## 2017-03-26 DIAGNOSIS — K7460 Unspecified cirrhosis of liver: Secondary | ICD-10-CM | POA: Diagnosis not present

## 2017-03-26 LAB — BASIC METABOLIC PANEL
BUN: 11 mg/dL (ref 6–23)
CHLORIDE: 104 meq/L (ref 96–112)
CO2: 28 meq/L (ref 19–32)
Calcium: 9.3 mg/dL (ref 8.4–10.5)
Creatinine, Ser: 0.92 mg/dL (ref 0.40–1.50)
GFR: 84.7 mL/min (ref >60.00)
Glucose, Bld: 129 mg/dL — ABNORMAL HIGH (ref 70–99)
POTASSIUM: 3.8 meq/L (ref 3.5–5.1)
SODIUM: 138 meq/L (ref 135–145)

## 2017-03-26 NOTE — Patient Instructions (Addendum)
Cut back on any extra salt in your diet. Continue on your usual meds. You will have labs checked today in the basement lab.  Please head down after you check out with the front desk  (bmet).  Please return to see Dr. Ardis Hughs in 6-8 months.  Normal BMI (Body Mass Index- based on height and weight) is between 23 and 30. Your BMI today is Body mass index is 36.88 kg/m. Daniel Lambert Please consider follow up  regarding your BMI with your Primary Care Provider.

## 2017-03-26 NOTE — Progress Notes (Signed)
Review of pertinent gastrointestinal problems: 1. Cirrhosis: NASH related, established care with Dr. Ardis Hughs 08/2016  Last BSJ:GGEZ 2016 for varices screening: Dr. Nadara Eaton in [facility]. Findings:small esophageal varices and portal hypertensive gastropathy.Gastric biopsies c/w chronic gastritis with intestinal metaplasia, no h. Pylori. EGD 2012: gastroduodenitis. Gastric biopsies and duodenal bx done and c/wmild to moderate chronic active gastritis andmild acute duodenitis. Last EGD with biopsies also + for chronic active gastritis, no H.pylori.   EGD August 2016 outside showed portal hypertensive gastropathy, esophageal varices.  EGD 01/2017 Dr. Ardis Hughs small esophageal varices, mild PHTN gastropathy, abnormal gastric mucosa pre-pyloric: path essentially same as outside path "intenstinal metaplasia, no dysplasia." recall EGD at 2 year inteval  Hepatoma screening:US 08/2016 cirrhosis, splenomegaly, no mass lesions. CBD 10-60m, follow up MRI/MRCP showed CBD measures 555monly. "Confluent arterial phase enhancement in a 5.2 cm region of the lateral segment left hepatic lobe appears to be associated with some capsular retraction and high precontrast T2 signal intensity somewhat favoring confluent hepatic fibrosis over hepatocellular carcinoma or cholangiocarcinoma. There is also delayed phase enhancement. There can be overlap in the appearance of hese entities ; strongly consider biopsy. Other workuppossibilities may include surveillance MRI in 3 months time". Arranged recall MRI February 2019.  [place] repeated MRI November 2017.  I do not have access to that report but his hepatologist said it looked okay and they arranged for repeat MRI in [place] May 2019.  I will leave all further hepatoma screening to them.  Offered MRI Feb 2019, patient declined "his other doctor is taking care of his liver issues"  Mild pitting  edema, very sensitive to diuresis (08/2016 overdiuresis caused overt HE).    HPI: This is a pleasant [age]ear old man whom is here with his wife today.  I last saw him about 2 months ago at the time of an upper endoscopy.  Getting headaches more often recently.  Tylenol   Usually helps very well.  He has been eating more potato chips recently.  They are trying to eat out less often.  Chief complaint is cirrhosis  He recently declined hepatoma screening saying his "other doctors taking care of his liver issues"  His weight is up 9 pounds since last visit here in GI office 3-4 months ago (same scale)  [place]  ROS: complete GI ROS as described in HPI, all other review negative.  Constitutional:  No unintentional weight loss   Past Medical History:  Diagnosis Date  . Arthritis   . [place]  . Gallstones    INACTIVE  . Microscopic colitis   . NASH (nonalcoholic steatohepatitis)     Past Surgical History:  Procedure Laterality Date  . ANKLE SURGERY Right    x 2  . COLONSCOPY     X 6  . ESOPHAGOGASTRODUODENOSCOPY (EGD) WITH PROPOFOL N/A 01/24/2017   Procedure: ESOPHAGOGASTRODUODENOSCOPY (EGD) WITH PROPOFOL;  Surgeon: JaMilus BanisterMD;  Location: [facility];  Service: Endoscopy;  Laterality: N/A;  . MULTIPLE TOOTH EXTRACTIONS    . UPPER GI ENDOSCOPY     X 6    Current Outpatient Medications  Medication Sig Dispense Refill  . acetaminophen (TYLENOL) 500 MG tablet Take 500 mg by mouth every 6 (six) hours as needed for moderate pain or headache.    . Marland KitchenMILoride (MIDAMOR) 5 MG tablet Take 1 tablet (5 mg  total) by mouth daily. 90 tablet 3  . diphenoxylate-atropine (LOMOTIL) 2.5-0.025 MG tablet Take 1 tablet by mouth daily as needed for diarrhea or loose stools. (Patient taking differently: Take 1 tablet by mouth daily. ) 90 tablet 3  . furosemide (LASIX) 40 MG tablet Take 1 tablet (40 mg  total) by mouth daily. 90 tablet 3   No current facility-administered medications for this visit.     Allergies as of 03/26/2017 - Review Complete 03/26/2017  Allergen Reaction Noted  . Entocort ec [budesonide] Itching and Rash 08/15/2016  . Penicillins Hives and Other (See Comments) 07/23/2016  . Spironolactone Other (See Comments) 11/16/2016    Family History  Problem Relation Age of Onset  . Alzheimer's disease Mother   . Rheum arthritis Mother   . Thyroid disease Mother   . Heart disease Father   . Thyroid disease Maternal Aunt   . Thyroid disease Maternal Aunt     Social History   Socioeconomic History  . Marital status: Married    Spouse name: Not on file  . Number of children: 3  . Years of education: 54  . Highest education level: Not on file  Social Needs  . Financial resource strain: Not on file  . Food insecurity - worry: Not on file  . Food insecurity - inability: Not on file  . Transportation needs - medical: Not on file  . Transportation needs - non-medical: Not on file  Occupational History  . Occupation: Retired Chemical engineer  Tobacco Use  . Smoking status: Current Every Day Smoker    Packs/day: 0.50    Years: 60.00    Pack years: 30.00    Types: Cigarettes  . Smokeless tobacco: Never Used  Substance and Sexual Activity  . Alcohol use: No  . Drug use: No  . Sexual activity: Not on file  Other Topics Concern  . Not on file  Social History Narrative   Fun/Hobby: Fishing     Physical Exam: Ht 5' 6.5" (1.689 m)   Wt 232 lb (105.2 kg)   BMI 36.88 kg/m  Constitutional: generally well-appearing Psychiatric: alert and oriented x3 Abdomen: soft, nontender, nondistended, no obvious ascites, no peritoneal signs, normal bowel sounds 1+ pitting edema in lower extremities.  Assessment and plan: 77 y.o. male with cirrhosis, decompensated  He is starting to put on some fluid weight.  I am a bit reluctant to increase his diuretic coverage since  the last time I did that he had encephalopathy, renal insufficiency.  He has some room to improve his salt intake.  He has been eating a lot of chips lately and they eat out periodically.  He is going to try to modify that.  He will get a basic set of labs including a basic metabolic profile.  He will return to see me in 6-8 months and in the interim he is planning to see his previous Industrial/product designer, gastroenterologist in [place].  Please see the "Patient Instructions" section for addition details about the plan.  Owens Loffler, MD [facility] 03/26/2017, 1:45 PM

## 2017-05-20 LAB — PROTIME-INR

## 2019-04-29 IMAGING — MR MR ABDOMEN WO/W CM MRCP
10 of 23 series · 18 of 48 positions shown · IV contrast (multihance)
Comparison: Abdominal ultrasound 08/17/2016

CLINICAL DATA: Newly dilated common bile duct in the setting of
cirrhosis. For further workup and specific assessment for
malignancy.

EXAM:
MRI ABDOMEN WITHOUT AND WITH CONTRAST (INCLUDING MRCP)
TECHNIQUE: Multiplanar multisequence MR imaging of the abdomen was performed
both before and after the administration of intravenous contrast.
Heavily T2-weighted images of the biliary and pancreatic ducts were
obtained, and three-dimensional MRCP images were rendered by post
processing.
CONTRAST:  20mL MULTIHANCE GADOBENATE DIMEGLUMINE 529 MG/ML IV SOLN

[Series 3: T2 fat-sat · axial · 5.0mm · 0.78mm/px · z∈[-79,+161]mm · 2 of 49 slices shown]
[im 1/49]
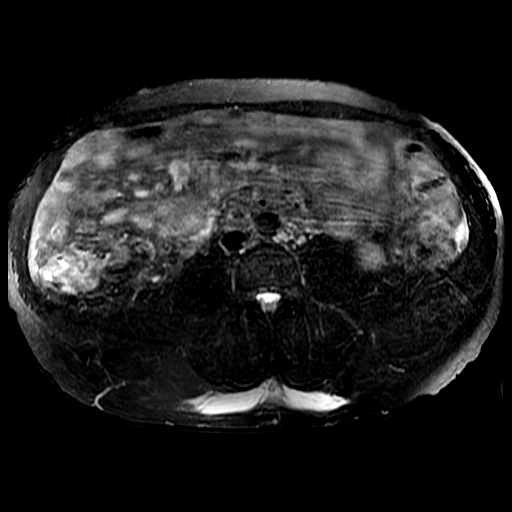
[im 49/49]
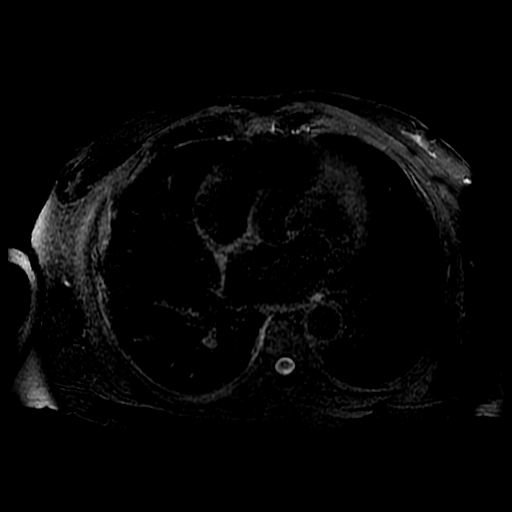

[Series 5: MRCP · coronal · 2.0mm · 0.70mm/px · 2 of 62 slices shown (1 of 2)]
[im 1/62]
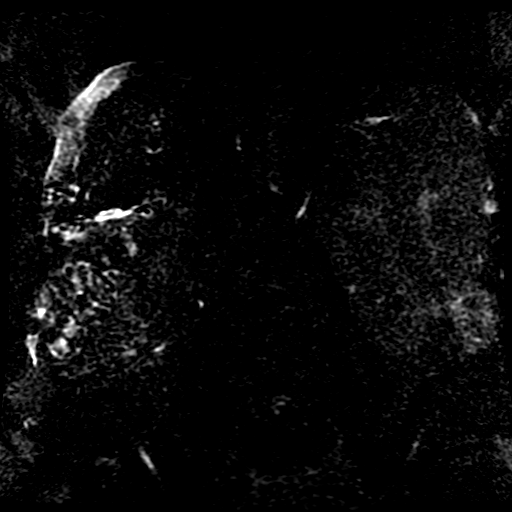
[im 62/62]
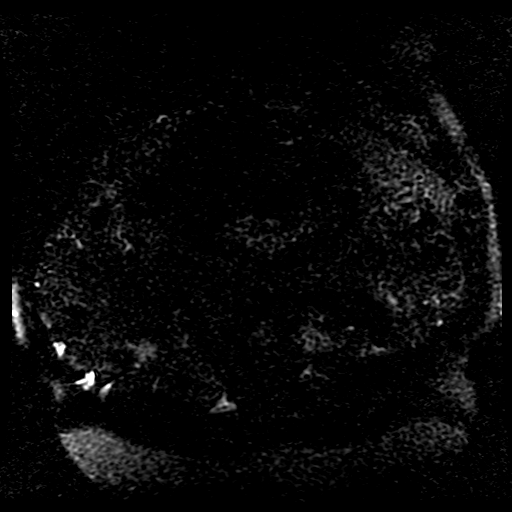

[Series 6: DWI b500 · axial · 6.0mm · 1.48mm/px · z∈[-109,+164]mm · 2 of 70 slices shown]
[im 1/70]
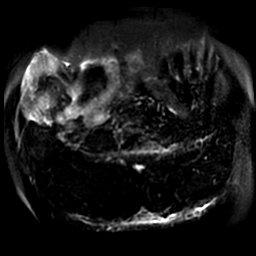
[im 70/70]
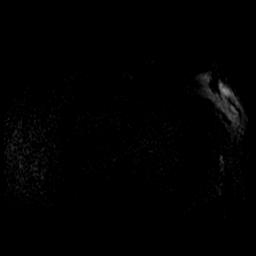

[Series 7: ax dualecho · axial · 5.0mm · 0.78mm/px · z∈[-94,+161]mm · 3 of 104 slices shown]
[im 1/104]
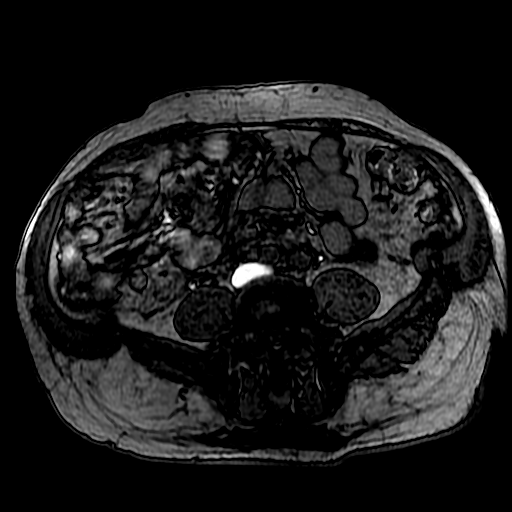
[im 52/104]
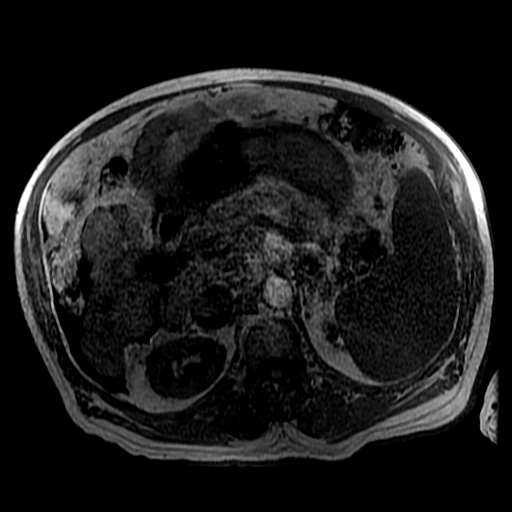
[im 104/104]
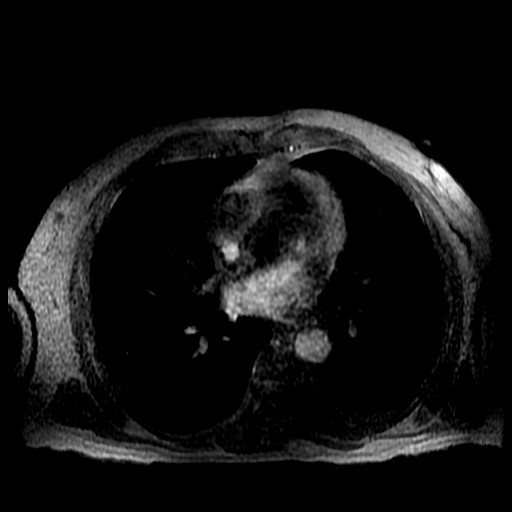

[Series 8: MRCP · sagittal · 40.0mm · 0.70mm/px · 1 of 6 slices shown (2 of 2)]
[im 1/6]
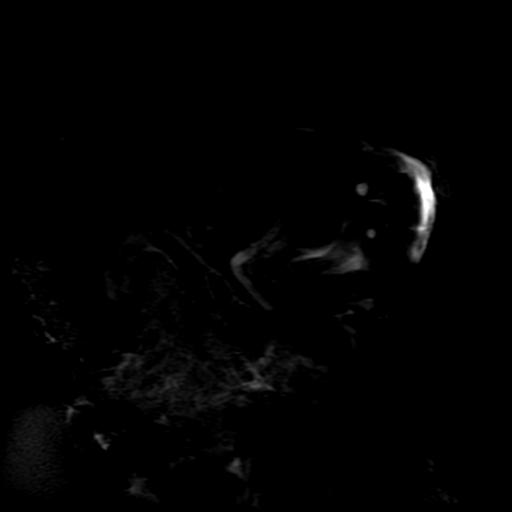

[Series 9: bSSFP fat-sat · coronal · 5.0mm · 0.82mm/px · 1 of 56 slices shown]
[im 1/56]
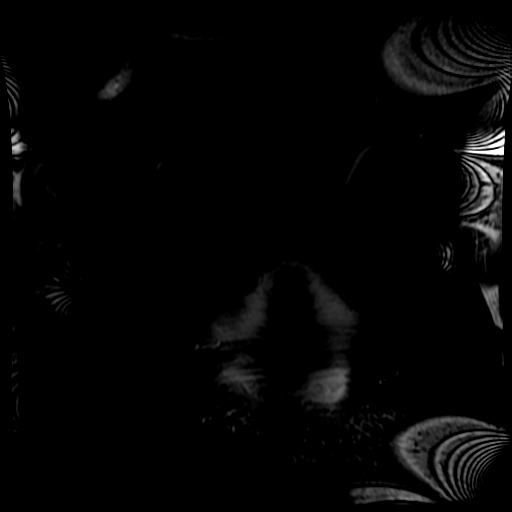

[Series 10: T2 · axial · 5.0mm · 0.78mm/px · 1 of 52 slices shown]
[im 1/52]
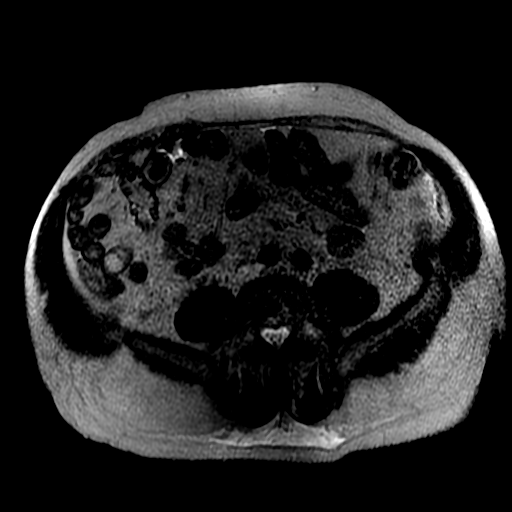

[Series 12: T1 dynamic · coronal · delayed · 7.0mm · 0.82mm/px · 2 of 88 slices shown]
[im 1/88]
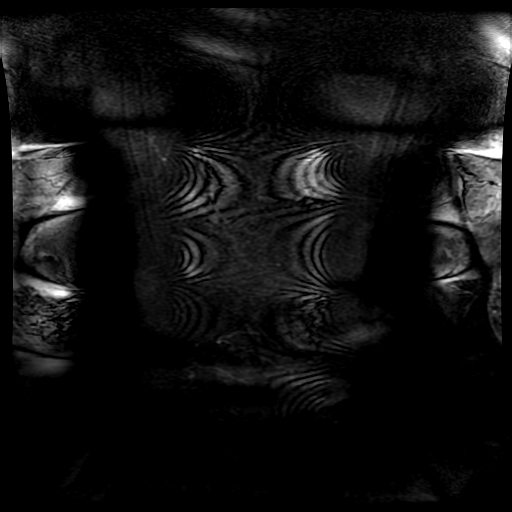
[im 88/88]
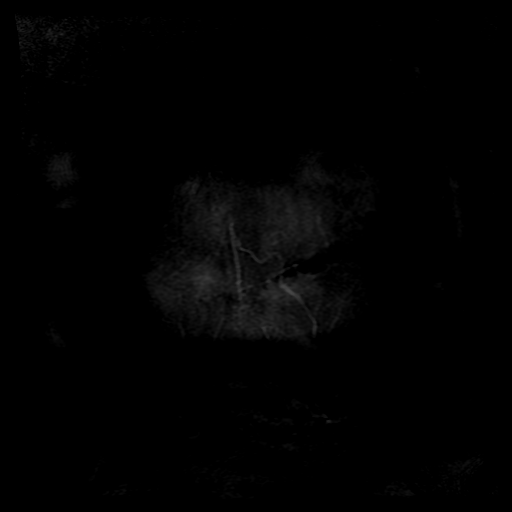

[Series 400: reformatted · axial · 1.6mm · 0.62mm/px · z∈[+17,+148]mm · 3 of 117 slices shown (1 of 2)]
[im 1/117]
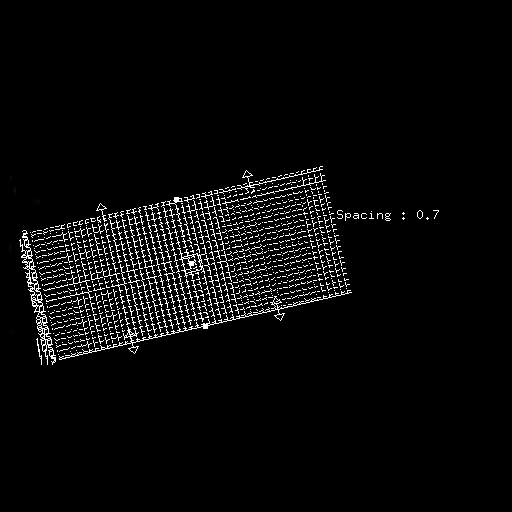
[im 59/117]
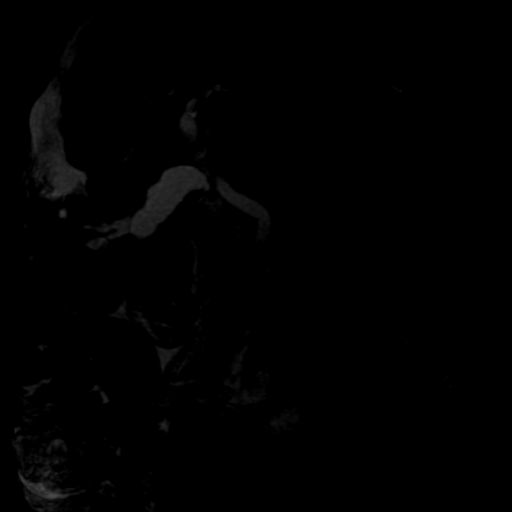
[im 117/117]
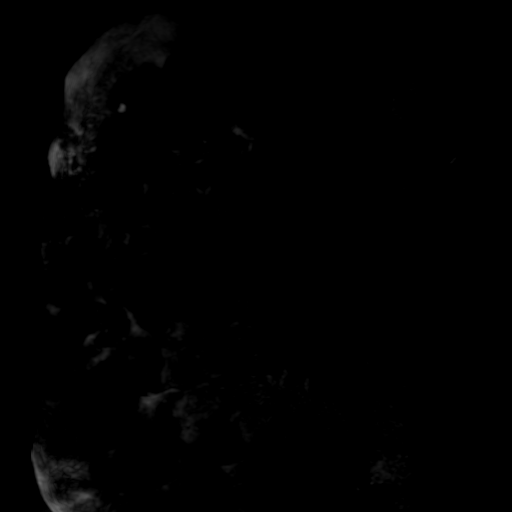

[Series 401: reformatted · coronal · 100.0mm · 0.51mm/px · 1 of 163 slices shown (2 of 2)]
[im 1/163]
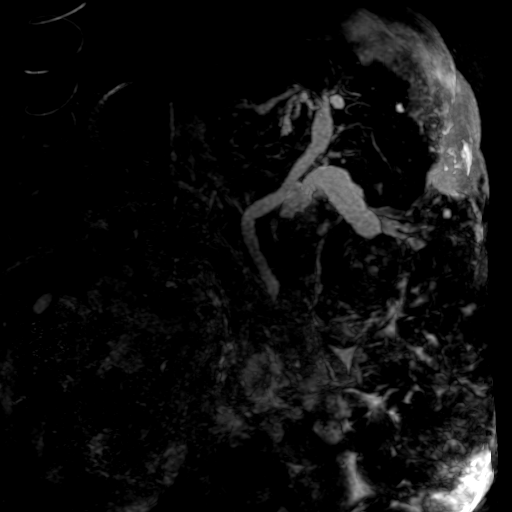

[18 of 48 positions shown; findings below may reference images not displayed]

FINDINGS: Lower chest: Mild cardiomegaly.

Hepatobiliary: Severe architectural distortion of the liver with
nodularity and extensive primarily central fibrosis, with
accentuated reticular T2 signal throughout the liver but especially
centrally, as shown for example on image [DATE].

Common bile duct measures up to 5 mm in diameter with the common
hepatic duct at 7 mm in diameter. There several gallstones in the
gallbladder measuring up to 10 mm in diameter but no
choledocholithiasis is appreciated. No obvious biliary stricture
noted although there is some borderline fusiform dilatation of the
confluence of the right and left hepatic ducts.

Accentuated precontrast T2 signal with some potential capsular
retraction along the lateral segment left hepatic lobe for example
image [DATE], with early arterials phase preferential enhancement in
this vicinity, favor confluent hepatic fibrosis although there is
overlap but appearance of this entity with hepatocellular carcinoma.
Area of involvement approximately 4.1 by 5.3 cm on image 32/1111. No
local intrahepatic biliary dilatation to suggest cholangiocarcinoma
as a cause.

Several small hepatic cysts are present.

Pancreas:  Unremarkable

Spleen: The spleen measures 16.1 by 8.8 by 15.2 cm (volume = 9933
cm^3). No focal splenic lesion identified.

Adrenals/Urinary Tract: Poor definition of the left adrenal gland
due to adjacent varices. 8 mm high T1 precontrast lesion in the
compatible with a small Bosniak category 2 cyst on subtraction
imaging. Several other benign-appearing renal cysts are present.

Stomach/Bowel: Unremarkable

Vascular/Lymphatic: The portal vein is patent. Splenic vein appears
patent but there is significant splenorenal shunting with
splenorenal varices noted. Scattered porta hepatis and
retroperitoneal lymph nodes are likely reactive.

Other: Low-level mesenteric and retroperitoneal edema. Perihepatic
ascites.

Musculoskeletal: Lumbar spondylosis and degenerative disc disease.
IMPRESSION: 1. Markedly severe architectural distortion of fibrosis associated
with cirrhosis. Confluent arterial phase enhancement in a 5.2 cm
region of the lateral segment left hepatic lobe appears to be
associated with some capsular retraction and high precontrast T2
signal intensity somewhat favoring confluent hepatic fibrosis over
hepatocellular carcinoma or cholangiocarcinoma. There is also
delayed phase enhancement. There can be overlap in the appearance of
these entities ; strongly consider biopsy. Other workup
possibilities may include surveillance MRI in 3 months time.
2. Currently the extrahepatic biliary system is within normal size
limits. Gallstones are present in the gallbladder but no
choledocholithiasis is identified.
3. Considerable splenomegaly. Splenorenal shunting with splenorenal
varices.
4. Benign-appearing renal cysts include a small Bosniak category 2
cyst of the right kidney.
5. Reactive porta hepatis and retroperitoneal lymph nodes.
6. Perihepatic ascites with edema throughout the mesentery and
omentum.

## 2020-02-16 DEATH — deceased
